# Patient Record
Sex: Male | Born: 1935
Health system: Southern US, Community
[De-identification: ages and names within clinical notes are randomized; demographics above are authoritative.]

## PROBLEM LIST (undated history)

## (undated) DIAGNOSIS — I454 Nonspecific intraventricular block: Secondary | ICD-10-CM

## (undated) DIAGNOSIS — E119 Type 2 diabetes mellitus without complications: Secondary | ICD-10-CM

## (undated) DIAGNOSIS — K219 Gastro-esophageal reflux disease without esophagitis: Secondary | ICD-10-CM

## (undated) DIAGNOSIS — I10 Essential (primary) hypertension: Secondary | ICD-10-CM

## (undated) DIAGNOSIS — N200 Calculus of kidney: Secondary | ICD-10-CM

## (undated) DIAGNOSIS — I480 Paroxysmal atrial fibrillation: Secondary | ICD-10-CM

## (undated) DIAGNOSIS — G459 Transient cerebral ischemic attack, unspecified: Secondary | ICD-10-CM

## (undated) HISTORY — PX: APPENDECTOMY: SHX54

## (undated) HISTORY — PX: CHOLECYSTECTOMY: SHX55

---

## 2014-12-15 DIAGNOSIS — H2703 Aphakia, bilateral: Secondary | ICD-10-CM | POA: Diagnosis not present

## 2014-12-21 DIAGNOSIS — I129 Hypertensive chronic kidney disease with stage 1 through stage 4 chronic kidney disease, or unspecified chronic kidney disease: Secondary | ICD-10-CM | POA: Diagnosis not present

## 2014-12-21 DIAGNOSIS — E119 Type 2 diabetes mellitus without complications: Secondary | ICD-10-CM | POA: Diagnosis not present

## 2014-12-21 DIAGNOSIS — E780 Pure hypercholesterolemia: Secondary | ICD-10-CM | POA: Diagnosis not present

## 2014-12-21 DIAGNOSIS — E1122 Type 2 diabetes mellitus with diabetic chronic kidney disease: Secondary | ICD-10-CM | POA: Diagnosis not present

## 2014-12-21 DIAGNOSIS — N189 Chronic kidney disease, unspecified: Secondary | ICD-10-CM | POA: Diagnosis not present

## 2014-12-25 DIAGNOSIS — B9689 Other specified bacterial agents as the cause of diseases classified elsewhere: Secondary | ICD-10-CM | POA: Diagnosis not present

## 2014-12-25 DIAGNOSIS — J208 Acute bronchitis due to other specified organisms: Secondary | ICD-10-CM | POA: Diagnosis not present

## 2015-04-19 DIAGNOSIS — E1122 Type 2 diabetes mellitus with diabetic chronic kidney disease: Secondary | ICD-10-CM | POA: Diagnosis not present

## 2015-04-19 DIAGNOSIS — Z79899 Other long term (current) drug therapy: Secondary | ICD-10-CM | POA: Diagnosis not present

## 2015-04-19 DIAGNOSIS — N183 Chronic kidney disease, stage 3 (moderate): Secondary | ICD-10-CM | POA: Diagnosis not present

## 2015-04-19 DIAGNOSIS — E780 Pure hypercholesterolemia: Secondary | ICD-10-CM | POA: Diagnosis not present

## 2015-04-19 DIAGNOSIS — E119 Type 2 diabetes mellitus without complications: Secondary | ICD-10-CM | POA: Diagnosis not present

## 2015-04-19 DIAGNOSIS — I10 Essential (primary) hypertension: Secondary | ICD-10-CM | POA: Diagnosis not present

## 2015-07-17 DIAGNOSIS — I959 Hypotension, unspecified: Secondary | ICD-10-CM | POA: Diagnosis not present

## 2015-07-17 DIAGNOSIS — Z7982 Long term (current) use of aspirin: Secondary | ICD-10-CM | POA: Diagnosis not present

## 2015-07-17 DIAGNOSIS — Z79899 Other long term (current) drug therapy: Secondary | ICD-10-CM | POA: Diagnosis not present

## 2015-07-17 DIAGNOSIS — R531 Weakness: Secondary | ICD-10-CM | POA: Diagnosis not present

## 2015-07-17 DIAGNOSIS — M533 Sacrococcygeal disorders, not elsewhere classified: Secondary | ICD-10-CM | POA: Diagnosis not present

## 2015-07-17 DIAGNOSIS — I10 Essential (primary) hypertension: Secondary | ICD-10-CM | POA: Diagnosis not present

## 2015-07-17 DIAGNOSIS — E860 Dehydration: Secondary | ICD-10-CM | POA: Diagnosis not present

## 2015-07-17 DIAGNOSIS — R0602 Shortness of breath: Secondary | ICD-10-CM | POA: Diagnosis not present

## 2015-07-20 DIAGNOSIS — Z0000 Encounter for general adult medical examination without abnormal findings: Secondary | ICD-10-CM | POA: Diagnosis not present

## 2015-07-20 DIAGNOSIS — I481 Persistent atrial fibrillation: Secondary | ICD-10-CM | POA: Diagnosis not present

## 2015-07-20 DIAGNOSIS — I10 Essential (primary) hypertension: Secondary | ICD-10-CM | POA: Diagnosis not present

## 2015-07-20 DIAGNOSIS — Z23 Encounter for immunization: Secondary | ICD-10-CM | POA: Diagnosis not present

## 2015-07-20 DIAGNOSIS — I482 Chronic atrial fibrillation: Secondary | ICD-10-CM | POA: Diagnosis not present

## 2015-08-03 DIAGNOSIS — N183 Chronic kidney disease, stage 3 (moderate): Secondary | ICD-10-CM | POA: Diagnosis not present

## 2015-08-03 DIAGNOSIS — E1122 Type 2 diabetes mellitus with diabetic chronic kidney disease: Secondary | ICD-10-CM | POA: Diagnosis not present

## 2015-08-03 DIAGNOSIS — I4891 Unspecified atrial fibrillation: Secondary | ICD-10-CM | POA: Diagnosis not present

## 2015-08-06 DIAGNOSIS — I482 Chronic atrial fibrillation: Secondary | ICD-10-CM | POA: Diagnosis not present

## 2015-08-09 DIAGNOSIS — I479 Paroxysmal tachycardia, unspecified: Secondary | ICD-10-CM | POA: Diagnosis not present

## 2015-08-23 DIAGNOSIS — I10 Essential (primary) hypertension: Secondary | ICD-10-CM | POA: Diagnosis not present

## 2015-08-23 DIAGNOSIS — I481 Persistent atrial fibrillation: Secondary | ICD-10-CM | POA: Diagnosis not present

## 2015-09-08 DIAGNOSIS — I480 Paroxysmal atrial fibrillation: Secondary | ICD-10-CM | POA: Diagnosis not present

## 2015-09-08 DIAGNOSIS — I447 Left bundle-branch block, unspecified: Secondary | ICD-10-CM | POA: Diagnosis not present

## 2015-09-08 DIAGNOSIS — I10 Essential (primary) hypertension: Secondary | ICD-10-CM | POA: Diagnosis not present

## 2015-09-08 DIAGNOSIS — I495 Sick sinus syndrome: Secondary | ICD-10-CM | POA: Diagnosis not present

## 2015-09-21 DIAGNOSIS — I482 Chronic atrial fibrillation: Secondary | ICD-10-CM | POA: Diagnosis not present

## 2015-09-21 DIAGNOSIS — I495 Sick sinus syndrome: Secondary | ICD-10-CM | POA: Diagnosis not present

## 2015-10-11 DIAGNOSIS — I4891 Unspecified atrial fibrillation: Secondary | ICD-10-CM | POA: Diagnosis not present

## 2015-10-11 DIAGNOSIS — I10 Essential (primary) hypertension: Secondary | ICD-10-CM | POA: Diagnosis not present

## 2017-07-21 ENCOUNTER — Emergency Department (HOSPITAL_COMMUNITY): Payer: Medicare Other

## 2017-07-21 ENCOUNTER — Observation Stay (HOSPITAL_COMMUNITY): Payer: Medicare Other

## 2017-07-21 ENCOUNTER — Encounter (HOSPITAL_COMMUNITY): Payer: Self-pay | Admitting: Emergency Medicine

## 2017-07-21 ENCOUNTER — Observation Stay (HOSPITAL_COMMUNITY)
Admission: EM | Admit: 2017-07-21 | Discharge: 2017-07-22 | Disposition: A | Discharge status: Home / Self Care | Payer: Medicare Other | Attending: Family Medicine | Admitting: Family Medicine

## 2017-07-21 DIAGNOSIS — I081 Rheumatic disorders of both mitral and tricuspid valves: Secondary | ICD-10-CM | POA: Insufficient documentation

## 2017-07-21 DIAGNOSIS — J324 Chronic pansinusitis: Secondary | ICD-10-CM | POA: Insufficient documentation

## 2017-07-21 DIAGNOSIS — Z87442 Personal history of urinary calculi: Secondary | ICD-10-CM | POA: Insufficient documentation

## 2017-07-21 DIAGNOSIS — R4701 Aphasia: Secondary | ICD-10-CM | POA: Diagnosis not present

## 2017-07-21 DIAGNOSIS — Z87891 Personal history of nicotine dependence: Secondary | ICD-10-CM | POA: Insufficient documentation

## 2017-07-21 DIAGNOSIS — I482 Chronic atrial fibrillation: Secondary | ICD-10-CM | POA: Diagnosis not present

## 2017-07-21 DIAGNOSIS — R4702 Dysphasia: Secondary | ICD-10-CM | POA: Diagnosis not present

## 2017-07-21 DIAGNOSIS — I447 Left bundle-branch block, unspecified: Secondary | ICD-10-CM | POA: Diagnosis not present

## 2017-07-21 DIAGNOSIS — Z7901 Long term (current) use of anticoagulants: Secondary | ICD-10-CM | POA: Diagnosis not present

## 2017-07-21 DIAGNOSIS — I495 Sick sinus syndrome: Secondary | ICD-10-CM | POA: Insufficient documentation

## 2017-07-21 DIAGNOSIS — Z8673 Personal history of transient ischemic attack (TIA), and cerebral infarction without residual deficits: Secondary | ICD-10-CM | POA: Insufficient documentation

## 2017-07-21 DIAGNOSIS — I480 Paroxysmal atrial fibrillation: Secondary | ICD-10-CM | POA: Insufficient documentation

## 2017-07-21 DIAGNOSIS — E785 Hyperlipidemia, unspecified: Secondary | ICD-10-CM | POA: Insufficient documentation

## 2017-07-21 DIAGNOSIS — G3189 Other specified degenerative diseases of nervous system: Secondary | ICD-10-CM | POA: Diagnosis not present

## 2017-07-21 DIAGNOSIS — K219 Gastro-esophageal reflux disease without esophagitis: Secondary | ICD-10-CM | POA: Diagnosis not present

## 2017-07-21 DIAGNOSIS — R4182 Altered mental status, unspecified: Secondary | ICD-10-CM | POA: Diagnosis present

## 2017-07-21 DIAGNOSIS — G459 Transient cerebral ischemic attack, unspecified: Principal | ICD-10-CM | POA: Insufficient documentation

## 2017-07-21 DIAGNOSIS — I10 Essential (primary) hypertension: Secondary | ICD-10-CM | POA: Insufficient documentation

## 2017-07-21 DIAGNOSIS — E119 Type 2 diabetes mellitus without complications: Secondary | ICD-10-CM | POA: Diagnosis not present

## 2017-07-21 DIAGNOSIS — I639 Cerebral infarction, unspecified: Secondary | ICD-10-CM | POA: Diagnosis not present

## 2017-07-21 HISTORY — DX: Essential (primary) hypertension: I10

## 2017-07-21 HISTORY — DX: Calculus of kidney: N20.0

## 2017-07-21 HISTORY — DX: Gastro-esophageal reflux disease without esophagitis: K21.9

## 2017-07-21 HISTORY — DX: Paroxysmal atrial fibrillation: I48.0

## 2017-07-21 HISTORY — DX: Nonspecific intraventricular block: I45.4

## 2017-07-21 HISTORY — DX: Type 2 diabetes mellitus without complications: E11.9

## 2017-07-21 HISTORY — DX: Transient cerebral ischemic attack, unspecified: G45.9

## 2017-07-21 LAB — URINALYSIS, ROUTINE W REFLEX MICROSCOPIC
BACTERIA UA: NONE SEEN
Bilirubin Urine: NEGATIVE
Glucose, UA: NEGATIVE mg/dL
Ketones, ur: NEGATIVE mg/dL
Leukocytes, UA: NEGATIVE
Nitrite: NEGATIVE
PROTEIN: NEGATIVE mg/dL
SPECIFIC GRAVITY, URINE: 1.009 (ref 1.005–1.030)
pH: 5 (ref 5.0–8.0)

## 2017-07-21 LAB — CBC
HCT: 43 % (ref 39.0–52.0)
Hemoglobin: 14.4 g/dL (ref 13.0–17.0)
MCH: 31.2 pg (ref 26.0–34.0)
MCHC: 33.5 g/dL (ref 30.0–36.0)
MCV: 93.1 fL (ref 78.0–100.0)
PLATELETS: 189 10*3/uL (ref 150–400)
RBC: 4.62 MIL/uL (ref 4.22–5.81)
RDW: 13.5 % (ref 11.5–15.5)
WBC: 6.6 10*3/uL (ref 4.0–10.5)

## 2017-07-21 LAB — I-STAT TROPONIN, ED: Troponin i, poc: 0 ng/mL (ref 0.00–0.08)

## 2017-07-21 LAB — CBG MONITORING, ED: GLUCOSE-CAPILLARY: 98 mg/dL (ref 65–99)

## 2017-07-21 LAB — COMPREHENSIVE METABOLIC PANEL
ALK PHOS: 51 U/L (ref 38–126)
ALT: 10 U/L — ABNORMAL LOW (ref 17–63)
ANION GAP: 14 (ref 5–15)
AST: 25 U/L (ref 15–41)
Albumin: 4.4 g/dL (ref 3.5–5.0)
BILIRUBIN TOTAL: 1.2 mg/dL (ref 0.3–1.2)
BUN: 19 mg/dL (ref 6–20)
CO2: 21 mmol/L — ABNORMAL LOW (ref 22–32)
Calcium: 9.5 mg/dL (ref 8.9–10.3)
Chloride: 103 mmol/L (ref 101–111)
Creatinine, Ser: 1.27 mg/dL — ABNORMAL HIGH (ref 0.61–1.24)
GFR calc Af Amer: 58 mL/min — ABNORMAL LOW (ref >60)
GFR calc non Af Amer: 50 mL/min — ABNORMAL LOW (ref >60)
GLUCOSE: 104 mg/dL — AB (ref 65–99)
POTASSIUM: 3.5 mmol/L (ref 3.5–5.1)
Sodium: 138 mmol/L (ref 135–145)
TOTAL PROTEIN: 7.5 g/dL (ref 6.5–8.1)

## 2017-07-21 LAB — I-STAT CHEM 8, ED
BUN: 21 mg/dL — AB (ref 6–20)
CALCIUM ION: 1.22 mmol/L (ref 1.15–1.40)
CHLORIDE: 104 mmol/L (ref 101–111)
Creatinine, Ser: 1.3 mg/dL — ABNORMAL HIGH (ref 0.61–1.24)
GLUCOSE: 102 mg/dL — AB (ref 65–99)
HCT: 45 % (ref 39.0–52.0)
Hemoglobin: 15.3 g/dL (ref 13.0–17.0)
Potassium: 3.6 mmol/L (ref 3.5–5.1)
SODIUM: 142 mmol/L (ref 135–145)
TCO2: 24 mmol/L (ref 22–32)

## 2017-07-21 LAB — DIFFERENTIAL
BASOS ABS: 0 10*3/uL (ref 0.0–0.1)
Basophils Relative: 0 %
EOS ABS: 0.1 10*3/uL (ref 0.0–0.7)
EOS PCT: 1 %
LYMPHS ABS: 0.9 10*3/uL (ref 0.7–4.0)
LYMPHS PCT: 14 %
MONOS PCT: 8 %
Monocytes Absolute: 0.5 10*3/uL (ref 0.1–1.0)
NEUTROS PCT: 77 %
Neutro Abs: 5.1 10*3/uL (ref 1.7–7.7)

## 2017-07-21 LAB — PROTIME-INR
INR: 1.1
Prothrombin Time: 14.2 seconds (ref 11.4–15.2)

## 2017-07-21 LAB — APTT: APTT: 35 s (ref 24–36)

## 2017-07-21 MED ORDER — VALSARTAN-HYDROCHLOROTHIAZIDE 320-12.5 MG PO TABS: 1.0000 | ORAL_TABLET | Freq: Every day | ORAL | Status: DC

## 2017-07-21 MED ORDER — SENNOSIDES-DOCUSATE SODIUM 8.6-50 MG PO TABS: 1.0000 | ORAL_TABLET | Freq: Every evening | ORAL | Status: DC | PRN

## 2017-07-21 MED ORDER — HYDROCHLOROTHIAZIDE 12.5 MG PO CAPS: 12.5000 mg | ORAL_CAPSULE | Freq: Every day | ORAL | Status: DC

## 2017-07-21 MED ORDER — AMLODIPINE BESYLATE 10 MG PO TABS: 10.0000 mg | ORAL_TABLET | Freq: Every day | ORAL | Status: DC

## 2017-07-21 MED ORDER — IRBESARTAN 300 MG PO TABS: 300.0000 mg | ORAL_TABLET | Freq: Every day | ORAL | Status: DC

## 2017-07-21 MED ORDER — APIXABAN 5 MG PO TABS
5.0000 mg | ORAL_TABLET | Freq: Two times a day (BID) | ORAL | Status: DC
Start: 2017-07-21 — End: 2017-07-22
  Administered 2017-07-21 – 2017-07-22 (×2): 5 mg via ORAL
  Filled 2017-07-21 (×2): qty 1

## 2017-07-21 MED ORDER — GADOBENATE DIMEGLUMINE 529 MG/ML IV SOLN
18.0000 mL | Freq: Once | INTRAVENOUS | Status: AC | PRN
  Administered 2017-07-21: 18 mL via INTRAVENOUS

## 2017-07-21 MED ORDER — ACETAMINOPHEN 160 MG/5ML PO SOLN: 650.0000 mg | ORAL | Status: DC | PRN

## 2017-07-21 MED ORDER — ACETAMINOPHEN 325 MG PO TABS: 650.0000 mg | ORAL_TABLET | ORAL | Status: DC | PRN

## 2017-07-21 MED ORDER — ACETAMINOPHEN 650 MG RE SUPP: 650.0000 mg | RECTAL | Status: DC | PRN

## 2017-07-21 NOTE — H&P (Signed)
Family Medicine [facility] Admission History and Physical Service Pager: [phone]  Patient name: Adrian Hill Medical record number: [id number] Date of birth: 1930-11-08 Age: 83 y.o. Gender: male  Primary Care Provider: Hadley Pen, MD Consultants: Neurology Code Status: Full  Chief Complaint: difficulty speaking  Assessment and Plan: Adrian Hill is a 83 y.o. male presenting after a witnessed episode of expressive dysphagia. PMH is significant for Afib on eliquis, HTN, DM, previous TIA, OA, LBBB noted on previous EKG, sick sinus syndrome w/o pacer.   Expressive aphasia concerning for TIA. Had witnessed episode of expressive aphasia at home, resolved on admission. History of TIA about 20 years ago that affected eye sight permanently per daughter, amaurosis fugax per care everywhere. Risk factors include afib, although anticoagulated on Eliquis and reportedly compliant with medications. Also hypertensive, BPs in ED variable though well controlled outpatient.  CT negative for acute intracranial process.  - Place in observation, attending Dr. Gwendolyn Grant - passed bedside swallow - cardiac monitoring - Risk stratification labs: TSH, A1C, CBC, Lipid panel - neurology consulted in ED, appreciate recommendations - MRI/MRA head and neck - ECHO - am EKG - carotid US - PT/OT/SLP - neuro checks q2h  H/o Diabetes Stable, diet controlled. Glucose on admission 102. - A1C check - monitor with Bmet  HTN BP in ED variable but at goal. Well controlled outpatient per Cardiology records. - Continue home norvasc, amlodipine, diovan  A fib, anticoagulated on Eliquis. Chronic, stable. CHADSVASC 5, compliant on anticoagulation at home - Continue eliquis - am EKG - ECHO  FEN/GI: heart healthy diet Prophylaxis: Eliquis  Disposition: place in observation   History of Present Illness:  Adrian Hill is a 83 y.o. male presenting with self-resolved episode of difficulty speaking.   Daughter states he woke up this morning in usual state of health around 9am. Was sitting at rest earlier this morning around 9:30am after breakfast and had difficulty speaking "wouldn't come out right" though he knew what he wanted to say. Per daughter, patient was talking but had difficulty with word finding "freezing instead of raining" without slurring or LOC or facial droop or weakness or confusion. Lasted 10-16min before self resolving. Had a headache that has since resolved. Has some lightheadedness, no dizziness. No vision changes. Has had TIA in the past 20+ year ago that caused some vision loss.  Review Of Systems: Per HPI with the following additions:   Review of Systems  Constitutional: Negative for chills and fever.  Eyes: Negative for photophobia.  Respiratory: Negative for shortness of breath.   Cardiovascular: Negative for chest pain and palpitations.  Gastrointestinal: Positive for constipation. Negative for abdominal pain, diarrhea, heartburn, nausea and vomiting.  Musculoskeletal: Negative for falls and myalgias.  Neurological: Positive for speech change (word finding difficulty) and headaches. Negative for dizziness, focal weakness, seizures and loss of consciousness.    Patient Active Problem List   Diagnosis Date Noted  . Altered mental status 07/21/2017    Past Medical History: Past Medical History:  Diagnosis Date  . Acid reflux   . BBB (bundle branch block)    Left BBB  . Diabetes (HCC)   . HTN (hypertension)   . Kidney stones   . Paroxysmal A-fib (HCC)   . TIA (transient ischemic attack)     Past Surgical History: Past Surgical History:  Procedure Laterality Date  . APPENDECTOMY    . CHOLECYSTECTOMY      Social History: Social History  Substance Use Topics  .  Smoking status: Former Smoker    Types: Cigars    Quit date: 54  . Smokeless tobacco: Never Used  . Alcohol use No   Additional social history: no recreational drugs. Lives at home by  himself. Please also refer to relevant sections of EMR.  Family History: Family History  Problem Relation Age of Onset  . Cancer Father   . Benign prostatic hyperplasia Brother   . Hypertension Brother   . Diabetes Mother   . Heart disease Mother     Allergies and Medications: No Known Allergies No current facility-administered medications on file prior to encounter.    No current outpatient prescriptions on file prior to encounter.    Objective: BP (!) 145/65 (BP Location: Left Arm)   Pulse 81   Temp 98.3 F (36.8 C) (Oral)   Resp 16   Ht  (1.905 m)   Wt 191 lb 3.2 oz (86.7 kg)   SpO2 100%   BMI 23.90 kg/m   Exam: General: lying comfortably in bed, in NAD Eyes: PERRL, EOMI, sclera white ENTM: mucous membranes moist Cardiovascular: irregularly irregular rhythm, normal rate. No murmurs/rubs/gallops Respiratory: CTA bilaterally, normal WOB, no wheezes/rales/rhonchi Gastrointestinal: NABS present, soft non tender to palpation, non distended Derm: WWP, no rashes, lesions MSK: Full ROM, strength and deep reflexes intact.  No edema.  Neuro: Alert and oriented, speech normal.  Optic field diminished at baseline with limited peripheral vision. Extraocular movements intact.  Intact symmetric sensation to light touch of face bilaterally.  Hearing loss bilaterally.  Tongue protrudes normally with no deviation.  Shoulder shrug, smile symmetric. Sensation intact and symmetric to extremities bilaterally. Strength 5/5 in all extremities. Intact finger to nose bilaterally. Psych: mood and affect euthymic.  Labs and Imaging: CBC BMET   Recent Labs Lab 07/21/17 1116 07/21/17 1135  WBC 6.6  --   HGB 14.4 15.3  HCT 43.0 45.0  PLT 189  --     Recent Labs Lab 07/21/17 1116 07/21/17 1135  NA 138 142  K 3.5 3.6  CL 103 104  CO2 21*  --   BUN 19 21*  CREATININE 1.27* 1.30*  GLUCOSE 104* 102*  CALCIUM 9.5  --       Ct Head Wo Contrast  Result Date:  07/21/2017 CLINICAL DATA:  Episode of aphasia this morning lasting about 15 minutes. EXAM: CT HEAD WITHOUT CONTRAST TECHNIQUE: Contiguous axial images were obtained from the base of the skull through the vertex without intravenous contrast. COMPARISON:  12/04/2016 FINDINGS: Brain: Age related atrophy. No sign of old or acute focal infarction, mass lesion, hemorrhage, hydrocephalus or extra-axial collection. Vascular: There is atherosclerotic calcification of the major vessels at the base of the brain. Skull: Normal Sinuses/Orbits: Complete opacification of the maxillary sinuses which are poorly developed. This is a chronic finding. Ethmoid, sphenoid and frontal sinuses are clear. Frontal sinuses are diminutive. Orbits negative. Mastoids clear. Other: None IMPRESSION: Age related atrophy.  No acute or focal finding otherwise. Electronically Signed   By: Paulina Fusi M.D.   On: 07/21/2017 11:49    Ellwood Dense, DO 07/21/2017, 4:02 PM PGY-1, [facility] FPTS Intern pager: [phone], text pages welcome  FPTS Upper-Level Resident Addendum  I have independently interviewed and examined the patient. I have discussed the above with the original author and agree with their documentation. My edits for correction/addition/clarification are in blue. Please see also any attending notes.   Leland Her, DO PGY-2, [facility] 07/21/2017 4:46  PM  FPTS Service pager: [phone] (text pages welcome through [facility])

## 2017-07-21 NOTE — ED Triage Notes (Signed)
CBG 98  

## 2017-07-21 NOTE — Evaluation (Addendum)
Physical Therapy Evaluation Patient Details Name: Adrian Hill MRN: [id number] DOB: 09-29-1931 Today's Date: 07/21/2017   History of Present Illness  Pt is an 83 y/o male admitted secondary to difficulty speaking. CT was negative. PMH including but not limited to DM, HTN, a-fib and hx of TIA.  Clinical Impression  Pt presented supine in bed with HOB elevated, awake and willing to participate in therapy session. Prior to admission, pt reported that he was independent with all functional mobility and ADLs. Pt reported that he lives alone with his dog but has family that lives very close by and could assist PRN. Pt ambulated in hallway with supervision for safety, no instability or overt LOB. Pt reported that he feels back to his baseline level of function. Pt's family present throughout session, with no questions or concerns regarding pt's functional mobility. No further acute PT needs identified at this time. PT signing off.    Follow Up Recommendations No PT follow up    Equipment Recommendations  None recommended by PT    Recommendations for Other Services       Precautions / Restrictions Precautions Precautions: None Restrictions Weight Bearing Restrictions: No      Mobility  Bed Mobility Overal bed mobility: Modified Independent                Transfers Overall transfer level: Needs assistance Equipment used: None Transfers: Sit to/from Stand Sit to Stand: Supervision         General transfer comment: supervision for safety  Ambulation/Gait Ambulation/Gait assistance: Supervision Ambulation Distance (Feet): 150 Feet Assistive device: None Gait Pattern/deviations: Step-through pattern;Decreased stride length (R LE externally rotated) Gait velocity: decreased   General Gait Details: no instability or LOB, supervision for safety  Stairs            Wheelchair Mobility    Modified Rankin (Stroke Patients Only)       Balance Overall balance  assessment: Needs assistance Sitting-balance support: Feet supported;No upper extremity supported Sitting balance-Leahy Scale: Good     Standing balance support: No upper extremity supported;During functional activity Standing balance-Leahy Scale: Fair                               Pertinent Vitals/Pain Pain Assessment: No/denies pain    Home Living Family/patient expects to be discharged to:: Private residence Living Arrangements: Alone Available Help at Discharge: Family;Available PRN/intermittently Type of Home: House Home Access: Stairs to enter Entrance Stairs-Rails: Doctor, general practice of Steps: 5 Home Layout: One level Home Equipment: None      Prior Function Level of Independence: Independent               Hand Dominance        Extremity/Trunk Assessment   Upper Extremity Assessment Upper Extremity Assessment: Overall WFL for tasks assessed    Lower Extremity Assessment Lower Extremity Assessment: Overall WFL for tasks assessed       Communication   Communication: HOH  Cognition Arousal/Alertness: Awake/alert Behavior During Therapy: WFL for tasks assessed/performed Overall Cognitive Status: Within Functional Limits for tasks assessed                                 General Comments: cognition not formally assessed but [facility] for general conversation      General Comments      Exercises     Assessment/Plan  PT Assessment Patent does not need any further PT services  PT Problem List         PT Treatment Interventions      PT Goals (Current goals can be found in the Care Plan section)  Acute Rehab PT Goals Patient Stated Goal: return home    Frequency     Barriers to discharge        Co-evaluation               AM-PAC PT "6 Clicks" Daily Activity  Outcome Measure Difficulty turning over in bed (including adjusting bedclothes, sheets and blankets)?: None Difficulty moving from  lying on back to sitting on the side of the bed? : None Difficulty sitting down on and standing up from a chair with arms (e.g., wheelchair, bedside commode, etc,.)?: None Help needed moving to and from a bed to chair (including a wheelchair)?: None Help needed walking in hospital room?: None Help needed climbing 3-5 steps with a railing? : A Little 6 Click Score: 23    End of Session Equipment Utilized During Treatment: Gait belt Activity Tolerance: Patient tolerated treatment well Patient left: in bed;with call bell/phone within reach;with family/visitor present Nurse Communication: Mobility status PT Visit Diagnosis: Other abnormalities of gait and mobility (R26.89)    Time: 1610-9604 PT Time Calculation (min) (ACUTE ONLY): 23 min   Charges:   PT Evaluation $PT Eval Moderate Complexity: 1 Mod PT Treatments $Gait Training: 8-22 mins   PT G Codes:   PT G-Codes **NOT FOR INPATIENT CLASS** Functional Assessment Tool Used: AM-PAC 6 Clicks Basic Mobility;Clinical judgement Functional Limitation: Mobility: Walking and moving around Mobility: Walking and Moving Around Current Status (V4098): At least 1 percent but less than 20 percent impaired, limited or restricted Mobility: Walking and Moving Around Goal Status [phone]): 0 percent impaired, limited or restricted Mobility: Walking and Moving Around Discharge Status [phone]): At least 1 percent but less than 20 percent impaired, limited or restricted    [facility] [place], DPT [phone]   Adrian Hill 07/21/2017, 5:41 PM

## 2017-07-21 NOTE — ED Provider Notes (Signed)
[facility]-EMERGENCY DEPT Provider Note   CSN: [id number] Arrival date & time: 07/21/17  1056     History   Chief Complaint Chief Complaint  Patient presents with  . Altered Mental Status    HPI Adrian Hill is a 83 y.o. male.  83 year old male with past medical history including HTN, A fib on Eliquis, TIA who p/w aphasia. Daughter states that this morning he was at their house and ate breakfast and shortly afterwards was trying to tell them something but for 15-30 minutes had difficulty getting his words out. He seemed like he knew what he was trying to say but cannot form the words. He eventually was able to speak normally again and has not had a reoccurrence since this episode. He denies any extremity numbness or weakness or any visual changes. He has been ambulatory without problems. Daughter reports that he has had history of TIA in the past but no history of stroke. He is compliant with medications.    The history is provided by the patient and a relative.    History reviewed. No pertinent past medical history.  There are no active problems to display for this patient.   No past surgical history on file.     Home Medications    Prior to Admission medications   Not on File    Family History No family history on file.  Social History Social History  Substance Use Topics  . Smoking status: Not on file  . Smokeless tobacco: Not on file  . Alcohol use Not on file     Allergies   Patient has no known allergies.   Review of Systems Review of Systems All other systems reviewed and are negative except that which was mentioned in HPI   Physical Exam Updated Vital Signs BP 136/62   Temp 97.7 F (36.5 C)   Resp 19   SpO2 100%   Physical Exam  Constitutional: He is oriented to person, place, and time. He appears well-developed and well-nourished. No distress.  Awake, alert  HENT:  Head: Normocephalic and atraumatic.  Eyes: Pupils are equal, round, and  reactive to light. Conjunctivae and EOM are normal.  Neck: Neck supple.  Cardiovascular: Normal rate, regular rhythm and normal heart sounds.   No murmur heard. Pulmonary/Chest: Effort normal and breath sounds normal. No respiratory distress.  Abdominal: Soft. Bowel sounds are normal. He exhibits no distension. There is no tenderness.  Musculoskeletal: He exhibits no edema.  Neurological: He is alert and oriented to person, place, and time. He has normal reflexes. No cranial nerve deficit. He exhibits normal muscle tone.  Fluent speech, no clonus Hard of hearing 5/5 strength and normal sensation x all 4 extremities  Skin: Skin is warm and dry.  Psychiatric: He has a normal mood and affect. Judgment and thought content normal.  Nursing note and vitals reviewed.    ED Treatments / Results  Labs (all labs ordered are listed, but only abnormal results are displayed) Labs Reviewed  COMPREHENSIVE METABOLIC PANEL - Abnormal; Notable for the following:       Result Value   CO2 21 (*)    Glucose, Bld 104 (*)    Creatinine, Ser 1.27 (*)    ALT 10 (*)    GFR calc non Af Amer 50 (*)    GFR calc Af Amer 58 (*)    All other components within normal limits  URINALYSIS, ROUTINE W REFLEX MICROSCOPIC - Abnormal; Notable for the following:  Color, Urine STRAW (*)    Hgb urine dipstick SMALL (*)    Squamous Epithelial / LPF 0-5 (*)    All other components within normal limits  I-STAT CHEM 8, ED - Abnormal; Notable for the following:    BUN 21 (*)    Creatinine, Ser 1.30 (*)    Glucose, Bld 102 (*)    All other components within normal limits  PROTIME-INR  APTT  CBC  DIFFERENTIAL  I-STAT TROPONIN, ED  CBG MONITORING, ED    EKG  EKG Interpretation  Date/Time:  Saturday July 21 2017 11:07:50 EDT Ventricular Rate:  84 PR Interval:    QRS Duration: 136 QT Interval:  380 QTC Calculation: 449 R Axis:   83 Text Interpretation:  Atrial fibrillation Non-specific intra-ventricular  conduction block Cannot rule out Anterior infarct , age undetermined Marked T-wave abnormality, consider inferolateral ischemia Abnormal ECG No previous ECGs available Confirmed by Frederick Peers [phone]) on 07/21/2017 11:25:09 AM       Radiology Ct Head Wo Contrast  Result Date: 07/21/2017 CLINICAL DATA:  Episode of aphasia this morning lasting about 15 minutes. EXAM: CT HEAD WITHOUT CONTRAST TECHNIQUE: Contiguous axial images were obtained from the base of the skull through the vertex without intravenous contrast. COMPARISON:  12/04/2016 FINDINGS: Brain: Age related atrophy. No sign of old or acute focal infarction, mass lesion, hemorrhage, hydrocephalus or extra-axial collection. Vascular: There is atherosclerotic calcification of the major vessels at the base of the brain. Skull: Normal Sinuses/Orbits: Complete opacification of the maxillary sinuses which are poorly developed. This is a chronic finding. Ethmoid, sphenoid and frontal sinuses are clear. Frontal sinuses are diminutive. Orbits negative. Mastoids clear. Other: None IMPRESSION: Age related atrophy.  No acute or focal finding otherwise. Electronically Signed   By: Paulina Fusi M.D.   On: 07/21/2017 11:49    Procedures Procedures (including critical care time)  Medications Ordered in ED Medications - No data to display   Initial Impression / Assessment and Plan / ED Course  I have reviewed the triage vital signs and the nursing notes.  Pertinent labs & imaging results that were available during my care of the patient were reviewed by me and considered in my medical decision making (see chart for details).    PT w/ 15-30 min episode of Aphasia witnessed by daughter. Symptoms resolved prior to arrival. He was well-appearing and comfortable on exam with reassuring vital signs. His neurologic exam was unremarkable. He does have difficulty hearing at baseline. His workup including head CT and screening lab work are reassuring. I  discussed his presentation with neurology, Dr. Otelia Limes, who will see the patient in consultation and agreed with plan to admit for TIA workup. Discussed with Dr. Artist Pais and pt admitted to teaching service for further evaluation.  Final Clinical Impressions(s) / ED Diagnoses   Final diagnoses:  Transient cerebral ischemia, unspecified type    New Prescriptions New Prescriptions   No medications on file     Shalika Arntz, Ambrose [place], MD 07/21/17 1735

## 2017-07-21 NOTE — ED Triage Notes (Addendum)
Pt daughter states normal last night. States he work up this morning and could not get his words out. Pt now speaking normally and appropriately. Ambulatory at triage, using urinal. Neuro screen negative at triage. A& O X4. Pt states yesterday he had a headache to the front of his head on both sides, still has a mild headache to same area. Other than this, he states some weakness since yesterday.

## 2017-07-21 NOTE — Consult Note (Signed)
Referring Physician: Dr. Mingo Amber    Chief Complaint: Transient speech deficit  HPI: Adrian Hill is an 83 y.o. male with atrial fibrillation (on Eliquis) who presents with transient expressive dysphasia. Per family, he woke up normally this AM. Later on in the morning, he was noted to be mixing up words and with hesitant speech, but comprehension was intact and after correcting himself, could express himself understandably to others. "He seemed like he knew what he was trying to say but cannot form the words." Symptoms lasted for 15-30 minutes. The patient also endorsed a mild frontal headache beginning yesterday, which still persisted at the time of his Triage assessment. He has no prior history of stroke but has had a TIA in the past. He denies new vision changes but does have bilateral vision loss, left worse than right, secondary to macular degeneration. CBG was 98 on arrival.   The patient has not had chest pain, limb weakness, sensory numbness, trouble swallowing, trouble walking or incoordination.   Past Medical History:  Diagnosis Date  . Acid reflux   . BBB (bundle branch block)    Left BBB  . Diabetes ([place])   . HTN (hypertension)   . Kidney stones   . Paroxysmal A-fib ([place])   . TIA (transient ischemic attack)     Past Surgical History:  Procedure Laterality Date  . APPENDECTOMY    . CHOLECYSTECTOMY      Family History  Problem Relation Age of Onset  . Cancer Father   . Benign prostatic hyperplasia Brother   . Hypertension Brother   . Diabetes Mother   . Heart disease Mother    Social History:  reports that he quit smoking about 22 years ago. His smoking use included Cigars. He has never used smokeless tobacco. He reports that he does not drink alcohol or use drugs.  Allergies: No Known Allergies  Medications:  Prior to Admission:  Prescriptions Prior to Admission  Medication Sig Dispense Refill Last Dose  . acetaminophen (TYLENOL) 500 MG tablet Take 500 mg by  mouth every 6 (six) hours as needed for mild pain.   PRN  . amLODipine (NORVASC) 10 MG tablet Take 10 mg by mouth daily.   07/21/2017 at Unknown time  . apixaban (ELIQUIS) 5 MG TABS tablet Take 5 mg by mouth 2 (two) times daily.   07/21/2017 at 0930  . Menthol, Topical Analgesic, (ICY HOT EX) Apply 1 application topically as needed (Joint pain).   Past Week at Unknown time  . valsartan-hydrochlorothiazide (DIOVAN-HCT) 320-12.5 MG tablet Take 1 tablet by mouth daily.   07/21/2017 at Unknown time   Scheduled: . [START ON 07/22/2017] amLODipine  10 mg Oral Daily  . apixaban  5 mg Oral BID  . [START ON 07/22/2017] irbesartan  300 mg Oral Daily   And  . [START ON 07/22/2017] hydrochlorothiazide  12.5 mg Oral Daily    ROS: As per HPI.   Physical Examination: Blood pressure (!) 142/73, pulse (!) 57, temperature 98.2 F (36.8 C), temperature source Oral, resp. rate 16, height '6\' 3"'  (1.905 m), weight 86.7 kg (191 lb 3.2 oz), SpO2 100 %.  HEENT: [place]/AT Lungs: Respirations unlabored Ext: No edema  Neurologic Examination: Mental Status: Alert, oriented, thought content appropriate. Pleasant and cooperative. Speech fluent with intact comprehension and naming. Able to follow all commands without difficulty. Cranial Nerves: II:  Visual fields intact, diminished visual acuity noted. No pupillary asymmetry noted.  III,IV, VI: Ptosis not present, EOMI without nystagmus V,VII:  Smile symmetric, facial temp sensation normal bilaterally VIII: HOH IX,X: No hypophonia XI: Symmetric XII: Midline tongue extension  Motor: Right : Upper extremity   5/5    Left:     Upper extremity   5/5  Lower extremity   5/5     Lower extremity   5/5 Normal tone throughout; no atrophy noted Sensory: Temp and light touch intact x 4. No extinction.  Deep Tendon Reflexes:  2+ biceps, brachioradialis and patellae bilaterally. 0 achilles bilaterally. Toes downgoing.  Cerebellar: No ataxia with FNF bilaterally  Gait:  Deferred  Results for orders placed or performed during the hospital encounter of 07/21/17 (from the past 48 hour(s))  Urinalysis, Routine w reflex microscopic     Status: Abnormal   Collection Time: 07/21/17 11:12 AM  Result Value Ref Range   Color, Urine STRAW (A) YELLOW   APPearance CLEAR CLEAR   Specific Gravity, Urine 1.009 1.005 - 1.030   pH 5.0 5.0 - 8.0   Glucose, UA NEGATIVE NEGATIVE mg/dL   Hgb urine dipstick SMALL (A) NEGATIVE   Bilirubin Urine NEGATIVE NEGATIVE   Ketones, ur NEGATIVE NEGATIVE mg/dL   Protein, ur NEGATIVE NEGATIVE mg/dL   Nitrite NEGATIVE NEGATIVE   Leukocytes, UA NEGATIVE NEGATIVE   RBC / HPF 0-5 0 - 5 RBC/hpf   WBC, UA 0-5 0 - 5 WBC/hpf   Bacteria, UA NONE SEEN NONE SEEN   Squamous Epithelial / LPF 0-5 (A) NONE SEEN   Mucus PRESENT   Protime-INR     Status: None   Collection Time: 07/21/17 11:16 AM  Result Value Ref Range   Prothrombin Time 14.2 11.4 - 15.2 seconds   INR 1.10   APTT     Status: None   Collection Time: 07/21/17 11:16 AM  Result Value Ref Range   aPTT 35 24 - 36 seconds  CBC     Status: None   Collection Time: 07/21/17 11:16 AM  Result Value Ref Range   WBC 6.6 4.0 - 10.5 K/uL   RBC 4.62 4.22 - 5.81 MIL/uL   Hemoglobin 14.4 13.0 - 17.0 g/dL   HCT 43.0 39.0 - 52.0 %   MCV 93.1 78.0 - 100.0 fL   MCH 31.2 26.0 - 34.0 pg   MCHC 33.5 30.0 - 36.0 g/dL   RDW 13.5 11.5 - 15.5 %   Platelets 189 150 - 400 K/uL  Differential     Status: None   Collection Time: 07/21/17 11:16 AM  Result Value Ref Range   Neutrophils Relative % 77 %   Neutro Abs 5.1 1.7 - 7.7 K/uL   Lymphocytes Relative 14 %   Lymphs Abs 0.9 0.7 - 4.0 K/uL   Monocytes Relative 8 %   Monocytes Absolute 0.5 0.1 - 1.0 K/uL   Eosinophils Relative 1 %   Eosinophils Absolute 0.1 0.0 - 0.7 K/uL   Basophils Relative 0 %   Basophils Absolute 0.0 0.0 - 0.1 K/uL  Comprehensive metabolic panel     Status: Abnormal   Collection Time: 07/21/17 11:16 AM  Result Value Ref  Range   Sodium 138 135 - 145 mmol/L   Potassium 3.5 3.5 - 5.1 mmol/L   Chloride 103 101 - 111 mmol/L   CO2 21 (L) 22 - 32 mmol/L   Glucose, Bld 104 (H) 65 - 99 mg/dL   BUN 19 6 - 20 mg/dL   Creatinine, Ser 1.27 (H) 0.61 - 1.24 mg/dL   Calcium 9.5 8.9 - 10.3 mg/dL  Total Protein 7.5 6.5 - 8.1 g/dL   Albumin 4.4 3.5 - 5.0 g/dL   AST 25 15 - 41 U/L   ALT 10 (L) 17 - 63 U/L   Alkaline Phosphatase 51 38 - 126 U/L   Total Bilirubin 1.2 0.3 - 1.2 mg/dL   GFR calc non Af Amer 50 (L) >60 mL/min   GFR calc Af Amer 58 (L) >60 mL/min    Comment: (NOTE) The eGFR has been calculated using the CKD EPI equation. This calculation has not been validated in all clinical situations. eGFR's persistently <60 mL/min signify possible Chronic Kidney Disease.    Anion gap 14 5 - 15  I-stat troponin, ED     Status: None   Collection Time: 07/21/17 11:29 AM  Result Value Ref Range   Troponin i, poc 0.00 0.00 - 0.08 ng/mL   Comment 3            Comment: Due to the release kinetics of cTnI, a negative result within the first hours of the onset of symptoms does not rule out myocardial infarction with certainty. If myocardial infarction is still suspected, repeat the test at appropriate intervals.   I-Stat Chem 8, ED     Status: Abnormal   Collection Time: 07/21/17 11:35 AM  Result Value Ref Range   Sodium 142 135 - 145 mmol/L   Potassium 3.6 3.5 - 5.1 mmol/L   Chloride 104 101 - 111 mmol/L   BUN 21 (H) 6 - 20 mg/dL   Creatinine, Ser 1.30 (H) 0.61 - 1.24 mg/dL   Glucose, Bld 102 (H) 65 - 99 mg/dL   Calcium, Ion 1.22 1.15 - 1.40 mmol/L   TCO2 24 22 - 32 mmol/L   Hemoglobin 15.3 13.0 - 17.0 g/dL   HCT 45.0 39.0 - 52.0 %  CBG monitoring, ED     Status: None   Collection Time: 07/21/17 12:20 PM  Result Value Ref Range   Glucose-Capillary 98 65 - 99 mg/dL   Ct Head Wo Contrast  Result Date: 07/21/2017 CLINICAL DATA:  Episode of aphasia this morning lasting about 15 minutes. EXAM: CT HEAD WITHOUT  CONTRAST TECHNIQUE: Contiguous axial images were obtained from the base of the skull through the vertex without intravenous contrast. COMPARISON:  12/04/2016 FINDINGS: Brain: Age related atrophy. No sign of old or acute focal infarction, mass lesion, hemorrhage, hydrocephalus or extra-axial collection. Vascular: There is atherosclerotic calcification of the major vessels at the base of the brain. Skull: Normal Sinuses/Orbits: Complete opacification of the maxillary sinuses which are poorly developed. This is a chronic finding. Ethmoid, sphenoid and frontal sinuses are clear. Frontal sinuses are diminutive. Orbits negative. Mastoids clear. Other: None IMPRESSION: Age related atrophy.  No acute or focal finding otherwise. Electronically Signed   By: Nelson Chimes M.D.   On: 07/21/2017 11:49    Assessment: 83 y.o. male with transient expressive dysphasia. Most likely secondary to TIA.  1. Neurological exam is nonfocal. Speech is normal. 2. CT head shows no acute abnormality. Age related atrophy is noted. 3. Stroke Risk Factors - HTN, DM, atrial fibrillation, prior TIA.  Plan: 1. Continue Eliquis 2. Given advanced age, benefits of statin most likely outweighed by risks 3. MRI, MRA of the brain without contrast 4. Echocardiogram 5. Carotid dopplers 6. Telemetry monitoring 7. Frequent neuro checks 8. HgbA1c, fasting lipid panel 9.  PT consult, OT consult, Speech consult 10. Permissive HTN until noon on Sunday 11. Encourage PO hydration  '@Electronically'  signed: Dr. Kerney Elbe  07/21/2017, 6:11 PM

## 2017-07-21 NOTE — Progress Notes (Signed)
Pt arrived from ED by stretcher. Family at bedside. Oriented to room.

## 2017-07-21 NOTE — Progress Notes (Signed)
Telemetry called to report a pause on the monitor; strip saved to epic; MD notified.

## 2017-07-22 ENCOUNTER — Observation Stay (HOSPITAL_BASED_OUTPATIENT_CLINIC_OR_DEPARTMENT_OTHER): Payer: Medicare Other

## 2017-07-22 ENCOUNTER — Encounter (HOSPITAL_COMMUNITY): Payer: PRIVATE HEALTH INSURANCE

## 2017-07-22 DIAGNOSIS — I340 Nonrheumatic mitral (valve) insufficiency: Secondary | ICD-10-CM

## 2017-07-22 DIAGNOSIS — R4701 Aphasia: Secondary | ICD-10-CM | POA: Diagnosis not present

## 2017-07-22 DIAGNOSIS — I361 Nonrheumatic tricuspid (valve) insufficiency: Secondary | ICD-10-CM

## 2017-07-22 DIAGNOSIS — R4182 Altered mental status, unspecified: Secondary | ICD-10-CM

## 2017-07-22 DIAGNOSIS — G459 Transient cerebral ischemic attack, unspecified: Secondary | ICD-10-CM | POA: Diagnosis not present

## 2017-07-22 LAB — TSH: TSH: 1.451 u[IU]/mL (ref 0.350–4.500)

## 2017-07-22 LAB — ECHOCARDIOGRAM COMPLETE
Height: 75 in
WEIGHTICAEL: 3059.2 [oz_av]

## 2017-07-22 LAB — LIPID PANEL
CHOL/HDL RATIO: 3.8 ratio
Cholesterol: 141 mg/dL (ref 0–200)
HDL: 37 mg/dL — AB (ref >40)
LDL Cholesterol: 87 mg/dL (ref 0–99)
TRIGLYCERIDES: 83 mg/dL (ref <150)
VLDL: 17 mg/dL (ref 0–40)

## 2017-07-22 LAB — BASIC METABOLIC PANEL
Anion gap: 6 (ref 5–15)
BUN: 17 mg/dL (ref 6–20)
CHLORIDE: 106 mmol/L (ref 101–111)
CO2: 27 mmol/L (ref 22–32)
CREATININE: 1.19 mg/dL (ref 0.61–1.24)
Calcium: 9.5 mg/dL (ref 8.9–10.3)
GFR calc Af Amer: >60 mL/min (ref >60)
GFR calc non Af Amer: 54 mL/min — ABNORMAL LOW (ref >60)
GLUCOSE: 109 mg/dL — AB (ref 65–99)
Potassium: 3.8 mmol/L (ref 3.5–5.1)
Sodium: 139 mmol/L (ref 135–145)

## 2017-07-22 LAB — CBC
HEMATOCRIT: 41.3 % (ref 39.0–52.0)
HEMOGLOBIN: 14 g/dL (ref 13.0–17.0)
MCH: 31.3 pg (ref 26.0–34.0)
MCHC: 33.9 g/dL (ref 30.0–36.0)
MCV: 92.4 fL (ref 78.0–100.0)
Platelets: 187 10*3/uL (ref 150–400)
RBC: 4.47 MIL/uL (ref 4.22–5.81)
RDW: 13.3 % (ref 11.5–15.5)
WBC: 6.6 10*3/uL (ref 4.0–10.5)

## 2017-07-22 LAB — HEMOGLOBIN A1C
Hgb A1c MFr Bld: 6.2 % — ABNORMAL HIGH (ref 4.8–5.6)
MEAN PLASMA GLUCOSE: 131.24 mg/dL

## 2017-07-22 NOTE — Progress Notes (Signed)
Pt given discharge instructions and took down via wheelchair with all of pt belongings.

## 2017-07-22 NOTE — Progress Notes (Signed)
  Speech Language Pathology  Patient Details Name: Adrian Hill MRN: [id number] DOB: [birth year] Today's Date: 07/22/2017 Time:  -     Spoke with pt and daughter/family re: current cognitive-speech-language status. Language is back to baseline; no difficulties with cognition. No ST needs.            Royce Macadamia 07/22/2017, 11:01 AM  Breck Coons Lonell Face.Ed [employer] [phone]

## 2017-07-22 NOTE — Progress Notes (Signed)
Family Medicine Teaching Service Daily Progress Note Intern Pager: [phone]  Patient name: Adrian Hill Medical record number: [id number] Date of birth: 11-03-1931 Age: 83 y.o. Gender: male  Primary Care Provider: Hadley Pen, MD Consultants: Neurology Code Status: Full  Pt Overview and Major Events to Date:  9/15 placed in observation for expressive aphasia  Assessment and Plan: Adrian Hill is a 83 y.o. male presenting after a witnessed episode of expressive dysphagia. PMH is significant for Afib on eliquis, HTN, DM, previous TIA, OA, LBBB noted on previous EKG, sick sinus syndrome w/o pacer.   Expressive aphasia concerning for TIA. Had witnessed episode of expressive aphasia at home, resolved on admission. Neuro exam unremarkable this morning. Risk factors include afib on Eliquis, DM, HTN, prior TIA. CT head and MRI/MRA head&neck negative for acute intracranial process.  - cardiac monitoring - Risk stratification labs: TSH 1.451, A1C 6.2, LDL 87 - neurology consulted in ED following, appreciate recommendations - ECHO - carotid US - PT/OT/SLP - neuro checks q2h  H/o Diabetes Stable, diet controlled. A1c 6.2 c/w prediabetes - monitor gluc with Bmet - patient wishes to discuss management with PCP as outpt  HTN Normotensive  - holding home meds until noon today 9/16 per neuro  A fib, anticoagulated on Eliquis. Chronic, stable. CHADSVASC 5, compliant on anticoagulation at home - Continue eliquis - ECHO  FEN/GI: heart healthy diet Prophylaxis: Eliquis  Disposition: pending neuro work up  Subjective:  Feels well this morning, no concerns. States has been on atorvastatin in the past and does not wish to resume, wants to discuss hyperlipidemia and prediabetes with PCP as outpatient.  Objective: Temp:  [97.7 F (36.5 C)-98.3 F (36.8 C)] 97.7 F (36.5 C) (09/16 0529) Pulse Rate:  [41-81] 64 (09/16 0529) Resp:  [16-20] 18 (09/16 0529) BP: (106-154)/(47-75)  124/64 (09/16 0529) SpO2:  [98 %-100 %] 98 % (09/16 0529) Weight:  [86.7 kg (191 lb 3.2 oz)] 86.7 kg (191 lb 3.2 oz) (09/15 1459) Physical Exam: General: sitting up in bed comfortably, in NAD Cardiovascular: RRR, no murmurs Respiratory: CTAB, normal work of breathing on room air Abdomen: soft, nontender, nondistended, + bowel sounds Extremities: no LE edema, moving all limbs equally Neuro: alert and oriented, strength 5/5 throughout, sensation intact throughout, face symmetric. EOMI and PERRL. DTRs 2+ b/l.  Laboratory:  Recent Labs Lab 07/21/17 1116 07/21/17 1135 07/22/17 0554  WBC 6.6  --  6.6  HGB 14.4 15.3 14.0  HCT 43.0 45.0 41.3  PLT 189  --  187    Recent Labs Lab 07/21/17 1116 07/21/17 1135 07/22/17 0554  NA 138 142 139  K 3.5 3.6 3.8  CL 103 104 106  CO2 21*  --  27  BUN 19 21* 17  CREATININE 1.27* 1.30* 1.19  CALCIUM 9.5  --  9.5  PROT 7.5  --   --   BILITOT 1.2  --   --   ALKPHOS 51  --   --   ALT 10*  --   --   AST 25  --   --   GLUCOSE 104* 102* 109*   TSH 1.451 A1c 6.2  Lipid Panel     Component Value Date/Time   CHOL 141 07/22/2017 0554   TRIG 83 07/22/2017 0554   HDL 37 (L) 07/22/2017 0554   CHOLHDL 3.8 07/22/2017 0554   VLDL 17 07/22/2017 0554   LDLCALC 87 07/22/2017 0554    Imaging/Diagnostic Tests: Ct Head Wo Contrast  Result Date:  07/21/2017 CLINICAL DATA:  Episode of aphasia this morning lasting about 15 minutes. EXAM: CT HEAD WITHOUT CONTRAST TECHNIQUE: Contiguous axial images were obtained from the base of the skull through the vertex without intravenous contrast. COMPARISON:  12/04/2016 FINDINGS: Brain: Age related atrophy. No sign of old or acute focal infarction, mass lesion, hemorrhage, hydrocephalus or extra-axial collection. Vascular: There is atherosclerotic calcification of the major vessels at the base of the brain. Skull: Normal Sinuses/Orbits: Complete opacification of the maxillary sinuses which are poorly developed. This  is a chronic finding. Ethmoid, sphenoid and frontal sinuses are clear. Frontal sinuses are diminutive. Orbits negative. Mastoids clear. Other: None IMPRESSION: Age related atrophy.  No acute or focal finding otherwise. Electronically Signed   By: Paulina Fusi M.D.   On: 07/21/2017 11:49   Mr Adrian Hill Head Wo Contrast  Result Date: 07/21/2017 CLINICAL DATA:  Transient expressive dysphasia. Frontal headache. History of atrial fibrillation on Eliquis, TIA. EXAM: MRI HEAD WITHOUT AND WITH CONTRAST MRA HEAD WITHOUT CONTRAST MRI NECK WITHOUT AND WITH CONTRAST TECHNIQUE: Multiplanar, multiecho pulse sequences of the brain and surrounding structures were obtained without and with intravenous contrast. Angiographic images of the head were obtained using MRA technique without contrast. Multiplanar, multiecho pulse sequences of the neck and surrounding structures were obtained without and with intravenous contrast. CONTRAST:  18mL MULTIHANCE GADOBENATE DIMEGLUMINE 529 MG/ML IV SOLN COMPARISON:  CT HEAD July 21, 2017 at 1145 hours and CT cervical spine December 04, 2016 and MRI/MRA head August 25, 2011 FINDINGS: MRI HEAD FINDINGS INTRACRANIAL CONTENTS: No reduced diffusion to suggest acute ischemia. No susceptibility artifact to suggest hemorrhage. The ventricles and sulci are normal for patient's age. No suspicious parenchymal signal, masses, mass effect. A few scattered subcentimeter supratentorial white matter FLAIR T2 hyperintensities compatible with mild chronic small vessel ischemic disease, less than expected for age. No abnormal intraparenchymal or extra-axial enhancement. No abnormal extra-axial fluid collections. No extra-axial masses. VASCULAR: Normal major intracranial vascular flow voids present at skull base. SKULL AND UPPER CERVICAL SPINE: No abnormal sellar expansion. No suspicious calvarial bone marrow signal. Craniocervical junction maintained. SINUSES/ORBITS: Pan paranasal sinusitis with atretic maxillary  sinuses compatible with chronic sinusitis. The included ocular globes and orbital contents are non-suspicious. OTHER: Patient is edentulous. MRA HEAD FINDINGS ANTERIOR CIRCULATION: Normal flow related enhancement of the included cervical, petrous, cavernous and supraclinoid internal carotid arteries. Patent anterior communicating artery. Patent anterior and middle cerebral arteries, including distal segments. No large vessel occlusion, high-grade stenosis, aneurysm. POSTERIOR CIRCULATION: LEFT vertebral artery is dominant. Basilar artery is patent, with normal flow related enhancement of the main branch vessels. Patent posterior cerebral arteries. No large vessel occlusion, high-grade stenosis, aneurysm. ANATOMIC VARIANTS: Hypoplastic RIGHT A1 segment. Source images and MIP images were reviewed. MRI NECK FINDINGS ANTERIOR CIRCULATION: The common carotid arteries are widely patent bilaterally. The carotid bifurcations are patent bilaterally without hemodynamically significant stenosis by NASCET criteria. No flow limiting stenosis or luminal irregularity. POSTERIOR CIRCULATION: Bilateral vertebral arteries are patent to the vertebrobasilar junction. LEFT vertebral artery is dominant. No flow limiting stenosis or luminal irregularity. Source images and MIP images were reviewed. IMPRESSION: Negative MRI of the head with and without contrast for age. Chronic sinusitis. Negative MRA of the head. Negative MRA of the neck. Electronically Signed   By: Awilda Metro M.D.   On: 07/21/2017 22:53   Mr Adrian Hill Neck W Wo Contrast  Result Date: 07/21/2017 CLINICAL DATA:  Transient expressive dysphasia. Frontal headache. History of atrial fibrillation on Eliquis, TIA. EXAM: MRI  HEAD WITHOUT AND WITH CONTRAST MRA HEAD WITHOUT CONTRAST MRI NECK WITHOUT AND WITH CONTRAST TECHNIQUE: Multiplanar, multiecho pulse sequences of the brain and surrounding structures were obtained without and with intravenous contrast. Angiographic images  of the head were obtained using MRA technique without contrast. Multiplanar, multiecho pulse sequences of the neck and surrounding structures were obtained without and with intravenous contrast. CONTRAST:  18mL MULTIHANCE GADOBENATE DIMEGLUMINE 529 MG/ML IV SOLN COMPARISON:  CT HEAD July 21, 2017 at 1145 hours and CT cervical spine December 04, 2016 and MRI/MRA head August 25, 2011 FINDINGS: MRI HEAD FINDINGS INTRACRANIAL CONTENTS: No reduced diffusion to suggest acute ischemia. No susceptibility artifact to suggest hemorrhage. The ventricles and sulci are normal for patient's age. No suspicious parenchymal signal, masses, mass effect. A few scattered subcentimeter supratentorial white matter FLAIR T2 hyperintensities compatible with mild chronic small vessel ischemic disease, less than expected for age. No abnormal intraparenchymal or extra-axial enhancement. No abnormal extra-axial fluid collections. No extra-axial masses. VASCULAR: Normal major intracranial vascular flow voids present at skull base. SKULL AND UPPER CERVICAL SPINE: No abnormal sellar expansion. No suspicious calvarial bone marrow signal. Craniocervical junction maintained. SINUSES/ORBITS: Pan paranasal sinusitis with atretic maxillary sinuses compatible with chronic sinusitis. The included ocular globes and orbital contents are non-suspicious. OTHER: Patient is edentulous. MRA HEAD FINDINGS ANTERIOR CIRCULATION: Normal flow related enhancement of the included cervical, petrous, cavernous and supraclinoid internal carotid arteries. Patent anterior communicating artery. Patent anterior and middle cerebral arteries, including distal segments. No large vessel occlusion, high-grade stenosis, aneurysm. POSTERIOR CIRCULATION: LEFT vertebral artery is dominant. Basilar artery is patent, with normal flow related enhancement of the main branch vessels. Patent posterior cerebral arteries. No large vessel occlusion, high-grade stenosis, aneurysm.  ANATOMIC VARIANTS: Hypoplastic RIGHT A1 segment. Source images and MIP images were reviewed. MRI NECK FINDINGS ANTERIOR CIRCULATION: The common carotid arteries are widely patent bilaterally. The carotid bifurcations are patent bilaterally without hemodynamically significant stenosis by NASCET criteria. No flow limiting stenosis or luminal irregularity. POSTERIOR CIRCULATION: Bilateral vertebral arteries are patent to the vertebrobasilar junction. LEFT vertebral artery is dominant. No flow limiting stenosis or luminal irregularity. Source images and MIP images were reviewed. IMPRESSION: Negative MRI of the head with and without contrast for age. Chronic sinusitis. Negative MRA of the head. Negative MRA of the neck. Electronically Signed   By: Awilda Metro M.D.   On: 07/21/2017 22:53   Mr Laqueta Jean WU Contrast  Result Date: 07/21/2017 CLINICAL DATA:  Transient expressive dysphasia. Frontal headache. History of atrial fibrillation on Eliquis, TIA. EXAM: MRI HEAD WITHOUT AND WITH CONTRAST MRA HEAD WITHOUT CONTRAST MRI NECK WITHOUT AND WITH CONTRAST TECHNIQUE: Multiplanar, multiecho pulse sequences of the brain and surrounding structures were obtained without and with intravenous contrast. Angiographic images of the head were obtained using MRA technique without contrast. Multiplanar, multiecho pulse sequences of the neck and surrounding structures were obtained without and with intravenous contrast. CONTRAST:  18mL MULTIHANCE GADOBENATE DIMEGLUMINE 529 MG/ML IV SOLN COMPARISON:  CT HEAD July 21, 2017 at 1145 hours and CT cervical spine December 04, 2016 and MRI/MRA head August 25, 2011 FINDINGS: MRI HEAD FINDINGS INTRACRANIAL CONTENTS: No reduced diffusion to suggest acute ischemia. No susceptibility artifact to suggest hemorrhage. The ventricles and sulci are normal for patient's age. No suspicious parenchymal signal, masses, mass effect. A few scattered subcentimeter supratentorial white matter FLAIR T2  hyperintensities compatible with mild chronic small vessel ischemic disease, less than expected for age. No abnormal intraparenchymal or extra-axial enhancement. No abnormal extra-axial  fluid collections. No extra-axial masses. VASCULAR: Normal major intracranial vascular flow voids present at skull base. SKULL AND UPPER CERVICAL SPINE: No abnormal sellar expansion. No suspicious calvarial bone marrow signal. Craniocervical junction maintained. SINUSES/ORBITS: Pan paranasal sinusitis with atretic maxillary sinuses compatible with chronic sinusitis. The included ocular globes and orbital contents are non-suspicious. OTHER: Patient is edentulous. MRA HEAD FINDINGS ANTERIOR CIRCULATION: Normal flow related enhancement of the included cervical, petrous, cavernous and supraclinoid internal carotid arteries. Patent anterior communicating artery. Patent anterior and middle cerebral arteries, including distal segments. No large vessel occlusion, high-grade stenosis, aneurysm. POSTERIOR CIRCULATION: LEFT vertebral artery is dominant. Basilar artery is patent, with normal flow related enhancement of the main branch vessels. Patent posterior cerebral arteries. No large vessel occlusion, high-grade stenosis, aneurysm. ANATOMIC VARIANTS: Hypoplastic RIGHT A1 segment. Source images and MIP images were reviewed. MRI NECK FINDINGS ANTERIOR CIRCULATION: The common carotid arteries are widely patent bilaterally. The carotid bifurcations are patent bilaterally without hemodynamically significant stenosis by NASCET criteria. No flow limiting stenosis or luminal irregularity. POSTERIOR CIRCULATION: Bilateral vertebral arteries are patent to the vertebrobasilar junction. LEFT vertebral artery is dominant. No flow limiting stenosis or luminal irregularity. Source images and MIP images were reviewed. IMPRESSION: Negative MRI of the head with and without contrast for age. Chronic sinusitis. Negative MRA of the head. Negative MRA of the  neck. Electronically Signed   By: Awilda Metro M.D.   On: 07/21/2017 22:53    Leland Her, DO 07/22/2017, 7:10 AM PGY-2, [facility] FPTS Intern pager: [phone], text pages welcome

## 2017-07-22 NOTE — Progress Notes (Signed)
OT Cancellation Note  Patient Details Name: Adrian Hill MRN: [id number] DOB: 07-21-1931   Cancelled Treatment:    Reason Eval/Treat Not Completed: OT screened, no needs identified, will sign off. Pt functioning at baseline for ADL participation. No acute OT needs identified and will sign off.   Doristine Section, MS OTR/L  Pager: [phone]   Doristine Section 07/22/2017, 7:46 AM

## 2017-07-22 NOTE — Progress Notes (Signed)
STROKE TEAM PROGRESS NOTE   HISTORY OF PRESENT ILLNESS (per record) Adrian Hill is an 83 y.o. male with atrial fibrillation (on Eliquis) who presents with transient expressive dysphasia. Per family, he woke up normally this AM. Later on in the morning, he was noted to be mixing up words and with hesitant speech, but comprehension was intact and after correcting himself, could express himself understandably to others. "He seemed like he knew what he was trying to say but cannot form the words." Symptoms lasted for 15-30 minutes. The patient also endorsed a mild frontal headache beginning yesterday, which still persisted at the time of his Triage assessment. He has no prior history of stroke but has had a TIA in the past. He denies new vision changes but does have bilateral vision loss, left worse than right, secondary to macular degeneration. CBG was 98 on arrival.   The patient has not had chest pain, limb weakness, sensory numbness, trouble swallowing, trouble walking or incoordination.    SUBJECTIVE (INTERVAL HISTORY) His  Daughter and grand daughter are  at the bedside.   He had a 10 minute episode of expressive language difficulties and word finding. He denies prior history of similar episodes of other TIAs or strokes. He has chronic A. fib and states he was compliant with his eliquis   OBJECTIVE Temp:  [97.7 F (36.5 C)-98.3 F (36.8 C)] 97.7 F (36.5 C) (09/16 0529) Pulse Rate:  [41-81] 64 (09/16 0529) Cardiac Rhythm: Atrial fibrillation (09/16 0700) Resp:  [16-20] 18 (09/16 0529) BP: (106-154)/(47-75) 124/64 (09/16 0529) SpO2:  [98 %-100 %] 98 % (09/16 0529) Weight:  [191 lb 3.2 oz (86.7 kg)] 191 lb 3.2 oz (86.7 kg) (09/15 1459)  CBC:   Recent Labs Lab 07/21/17 1116 07/21/17 1135 07/22/17 0554  WBC 6.6  --  6.6  NEUTROABS 5.1  --   --   HGB 14.4 15.3 14.0  HCT 43.0 45.0 41.3  MCV 93.1  --  92.4  PLT 189  --  187    Basic Metabolic Panel:   Recent Labs Lab  07/21/17 1116 07/21/17 1135 07/22/17 0554  NA 138 142 139  K 3.5 3.6 3.8  CL 103 104 106  CO2 21*  --  27  GLUCOSE 104* 102* 109*  BUN 19 21* 17  CREATININE 1.27* 1.30* 1.19  CALCIUM 9.5  --  9.5    Lipid Panel:     Component Value Date/Time   CHOL 141 07/22/2017 0554   TRIG 83 07/22/2017 0554   HDL 37 (L) 07/22/2017 0554   CHOLHDL 3.8 07/22/2017 0554   VLDL 17 07/22/2017 0554   LDLCALC 87 07/22/2017 0554   HgbA1c:  Lab Results  Component Value Date   HGBA1C 6.2 (H) 07/22/2017   Urine Drug Screen: No results found for: LABOPIA, COCAINSCRNUR, LABBENZ, AMPHETMU, THCU, LABBARB  Alcohol Level No results found for: ETH   IMAGING  Ct Head Wo Contrast 07/21/2017 IMPRESSION:  Age related atrophy.  No acute or focal finding otherwise.    Mr Shirlee Latch Wo Contrast MRA Neck 07/21/2017 IMPRESSION:  Negative MRI of the head with and without contrast for age. Chronic sinusitis.  Negative MRA of the head.  Negative MRA of the neck.     Transthoracic Echocardiogram - pending   Bilateral Carotid Dopplers - see negative MRA neck     PHYSICAL EXAM  Vitals:   07/21/17 2210 07/22/17 0030 07/22/17 0230 07/22/17 0529  BP: 126/64 (!) 111/56 (!) 106/54 124/64  Pulse: 76 78 68 64  Resp:   18 18  Temp: 97.9 F (36.6 C) 97.7 F (36.5 C) 97.7 F (36.5 C) 97.7 F (36.5 C)  TempSrc: Oral Oral Oral Oral  SpO2: 99% 98% 98% 98%  Weight:      Height:       Obese elderly Caucasian male currently not in distress. He is extremely hard of hearing.  . Afebrile. Head is nontraumatic. Neck is supple without bruit.    Cardiac exam no murmur or gallop. Lungs are clear to auscultation. Distal pulses are well felt.  Neurological Exam ;  Awake  Alert oriented x 3. Normal speech and language.eye movements full without nystagmus.fundi were not visualized. Vision acuity and fields appear normal. Hearing is severely diminished bilaterally. Palatal movements are normal. Face symmetric. Tongue  midline. Normal strength, tone, reflexes and coordination. Normal sensation. Gait deferred.  ASSESSMENT/PLAN Mr. Adrian Hill is a 83 y.o. male with history of TIA, PAF, hypertension, DM, Left BBBB presenting with transient speech difficulties. He did not receive IV t-PA due to resolution of deficits.  Possible TIA:    Resultant  No deficits CT head - Age related atrophy.  No acute or focal finding otherwise.   MRI head - negative  MRA head - negative  MRA Neck - negative  Carotid Doppler - negative MRA neck -> [place] cancelled  2D Echo - pending  LDL - 87  HgbA1c - 6.2  VTE prophylaxis - Eliquis Diet Heart Room service appropriate? Yes; Fluid consistency: Thin  Eliquis (apixaban) daily prior to admission, now on Eliquis (apixaban) daily  Patient counseled to be compliant with his antithrombotic medications  Ongoing aggressive stroke risk factor management  Therapy recommendations: pending  Disposition: Pending  Hypertension  Stable  Permissive hypertension (OK if < 220/120) but gradually normalize in 5-7 days  Long-term BP goal normotensive  Hyperlipidemia  Home meds: No lipid lowering medications prior to admission  LDL 87, goal < 70  Recommend low dose statin.  Continue statin at discharge  Diabetes  HgbA1c 6.2, goal < 7.0  Controlled  Other Stroke Risk Factors  Advanced age  Quit smoking 22 years ago.  Hx stroke/TIA  PAF  Other Active Problems  Mild obesity  Severe hearing loss bilateral  Hospital day # 0  I have personally examined this patient, reviewed notes, independently viewed imaging studies, participated in medical decision making and plan of care.ROS completed by me personally and pertinent positives fully documented  I have made any additions or clarifications directly to the above note.  He presented with transient expressive aphasia likely due to left hemispheric TIA. He remains at risk for recurrent TIAs and strokes. I had a  long discussion the patient and daughter regarding stroke evaluation, prevention and treatment and answered questions. We discussed alternative treatment options to eliquis including switching to pradaxa, Xarelto or Sayvasa  versus staying on eliquis I do not believe there is strong evidence to suggest either way. Patient and family decided to continue eliquis after our discussion. Greater than 50% time during this 35 minute visit was spent on discussing results of evaluation and treatment options including risk benefit of switching eliquis to alternative agent as well as adding aspirin and answering questions. Follow-up as an outpatient in the stroke clinic in 6 weeks. Discuss with primary medical team  Delia Heady, MD Medical Director [facility] Pager: [phone] 07/22/2017 12:06 PM  Delia Heady, MD Medical Director [facility] Pager: [phone]  07/22/2017 12:06 PM  To contact Stroke Continuity provider, please refer to [email or web address]. After hours, contact General Neurology

## 2017-07-22 NOTE — Progress Notes (Signed)
  Echocardiogram 2D Echocardiogram has been performed.  Pieter Partridge 07/22/2017, 11:38 AM

## 2017-07-22 NOTE — Discharge Summary (Signed)
Family Medicine Teaching [facility] Discharge Summary  Patient name: Adrian Hill Medical record number: [id number] Date of birth: [birth year] Age: 83 y.o. Gender: male Date of Admission: 07/21/2017  Date of Discharge: 07/22/17  Admitting Physician: Tobey Grim, MD  Primary Care Provider: Hadley Pen, MD Consultants: neurology  Indication for Hospitalization: TIA  Discharge Diagnoses/Problem List:  TIA - expressive aphasia resolved H/o diet controlled DM HTN Afib on eliquis  Disposition: home  Discharge Condition: stable  Discharge Exam: see progress note from day of discharge  Brief Hospital Course:  [name] presented with witnessed expressive aphasia x 15 min at home, which self resolved along with mild headache which resolved spontaneously. Hx of TIA 20 years ago with amaurosis fugax, also afib on eliquis. Neurology was consulted. EKG unchanged, troponin neg, lipid panel with LDL 87, TSH 1.45. CT head with age related atrophy, MRA head without large vessel occlusion, high grade stenosis, or aneursym. MRA neck negative. Echo with EF 60-65% and afib, no wall motion abnormalities. As symptoms had resolved, patient did not need any follow up PT/OT/SLP treatment. Neurology discussed possibly changing anticoagulation, but patient elected to remain on eliquis. Neurology also recommended low dose statin - patient would like to discuss with his PCP prior to starting.   Issues for Follow Up:  1. Statin - neuro recommended starting low dose statin, patient elected to discuss this with PCP prior to starting. 2. TIA - neurology recommended follow up in stroke clinic in 6 weeks. Had extensive discussion with patient regarding adding pradaxa vs changing to xarelto, patient elected to stay on eliquis. 3. DM - Hgba1c 6/2 - continued dietary modifications.  Significant Procedures: none  Significant Labs and Imaging:   Recent Labs Lab 07/21/17 1116 07/21/17 1135  07/22/17 0554  WBC 6.6  --  6.6  HGB 14.4 15.3 14.0  HCT 43.0 45.0 41.3  PLT 189  --  187    Recent Labs Lab 07/21/17 1116 07/21/17 1135 07/22/17 0554  NA 138 142 139  K 3.5 3.6 3.8  CL 103 104 106  CO2 21*  --  27  GLUCOSE 104* 102* 109*  BUN 19 21* 17  CREATININE 1.27* 1.30* 1.19  CALCIUM 9.5  --  9.5  ALKPHOS 51  --   --   AST 25  --   --   ALT 10*  --   --   ALBUMIN 4.4  --   --    Ct Head Wo Contrast  Result Date: 07/21/2017 CLINICAL DATA:  Episode of aphasia this morning lasting about 15 minutes. EXAM: CT HEAD WITHOUT CONTRAST TECHNIQUE: Contiguous axial images were obtained from the base of the skull through the vertex without intravenous contrast. COMPARISON:  12/04/2016 FINDINGS: Brain: Age related atrophy. No sign of old or acute focal infarction, mass lesion, hemorrhage, hydrocephalus or extra-axial collection. Vascular: There is atherosclerotic calcification of the major vessels at the base of the brain. Skull: Normal Sinuses/Orbits: Complete opacification of the maxillary sinuses which are poorly developed. This is a chronic finding. Ethmoid, sphenoid and frontal sinuses are clear. Frontal sinuses are diminutive. Orbits negative. Mastoids clear. Other: None IMPRESSION: Age related atrophy.  No acute or focal finding otherwise. Electronically Signed   By: Paulina Fusi M.D.   On: 07/21/2017 11:49   Mr Maxine Glenn Head Wo Contrast  Result Date: 07/21/2017 CLINICAL DATA:  Transient expressive dysphasia. Frontal headache. History of atrial fibrillation on Eliquis, TIA. EXAM: MRI HEAD WITHOUT AND WITH CONTRAST MRA  HEAD WITHOUT CONTRAST MRI NECK WITHOUT AND WITH CONTRAST TECHNIQUE: Multiplanar, multiecho pulse sequences of the brain and surrounding structures were obtained without and with intravenous contrast. Angiographic images of the head were obtained using MRA technique without contrast. Multiplanar, multiecho pulse sequences of the neck and surrounding structures were obtained  without and with intravenous contrast. CONTRAST:  18mL MULTIHANCE GADOBENATE DIMEGLUMINE 529 MG/ML IV SOLN COMPARISON:  CT HEAD July 21, 2017 at 1145 hours and CT cervical spine December 04, 2016 and MRI/MRA head August 25, 2011 FINDINGS: MRI HEAD FINDINGS INTRACRANIAL CONTENTS: No reduced diffusion to suggest acute ischemia. No susceptibility artifact to suggest hemorrhage. The ventricles and sulci are normal for patient's age. No suspicious parenchymal signal, masses, mass effect. A few scattered subcentimeter supratentorial white matter FLAIR T2 hyperintensities compatible with mild chronic small vessel ischemic disease, less than expected for age. No abnormal intraparenchymal or extra-axial enhancement. No abnormal extra-axial fluid collections. No extra-axial masses. VASCULAR: Normal major intracranial vascular flow voids present at skull base. SKULL AND UPPER CERVICAL SPINE: No abnormal sellar expansion. No suspicious calvarial bone marrow signal. Craniocervical junction maintained. SINUSES/ORBITS: Pan paranasal sinusitis with atretic maxillary sinuses compatible with chronic sinusitis. The included ocular globes and orbital contents are non-suspicious. OTHER: Patient is edentulous. MRA HEAD FINDINGS ANTERIOR CIRCULATION: Normal flow related enhancement of the included cervical, petrous, cavernous and supraclinoid internal carotid arteries. Patent anterior communicating artery. Patent anterior and middle cerebral arteries, including distal segments. No large vessel occlusion, high-grade stenosis, aneurysm. POSTERIOR CIRCULATION: LEFT vertebral artery is dominant. Basilar artery is patent, with normal flow related enhancement of the main branch vessels. Patent posterior cerebral arteries. No large vessel occlusion, high-grade stenosis, aneurysm. ANATOMIC VARIANTS: Hypoplastic RIGHT A1 segment. Source images and MIP images were reviewed. MRI NECK FINDINGS ANTERIOR CIRCULATION: The common carotid arteries  are widely patent bilaterally. The carotid bifurcations are patent bilaterally without hemodynamically significant stenosis by NASCET criteria. No flow limiting stenosis or luminal irregularity. POSTERIOR CIRCULATION: Bilateral vertebral arteries are patent to the vertebrobasilar junction. LEFT vertebral artery is dominant. No flow limiting stenosis or luminal irregularity. Source images and MIP images were reviewed. IMPRESSION: Negative MRI of the head with and without contrast for age. Chronic sinusitis. Negative MRA of the head. Negative MRA of the neck. Electronically Signed   By: Awilda Metro M.D.   On: 07/21/2017 22:53   Mr Maxine Glenn Neck W Wo Contrast  Result Date: 07/21/2017 CLINICAL DATA:  Transient expressive dysphasia. Frontal headache. History of atrial fibrillation on Eliquis, TIA. EXAM: MRI HEAD WITHOUT AND WITH CONTRAST MRA HEAD WITHOUT CONTRAST MRI NECK WITHOUT AND WITH CONTRAST TECHNIQUE: Multiplanar, multiecho pulse sequences of the brain and surrounding structures were obtained without and with intravenous contrast. Angiographic images of the head were obtained using MRA technique without contrast. Multiplanar, multiecho pulse sequences of the neck and surrounding structures were obtained without and with intravenous contrast. CONTRAST:  18mL MULTIHANCE GADOBENATE DIMEGLUMINE 529 MG/ML IV SOLN COMPARISON:  CT HEAD July 21, 2017 at 1145 hours and CT cervical spine December 04, 2016 and MRI/MRA head August 25, 2011 FINDINGS: MRI HEAD FINDINGS INTRACRANIAL CONTENTS: No reduced diffusion to suggest acute ischemia. No susceptibility artifact to suggest hemorrhage. The ventricles and sulci are normal for patient's age. No suspicious parenchymal signal, masses, mass effect. A few scattered subcentimeter supratentorial white matter FLAIR T2 hyperintensities compatible with mild chronic small vessel ischemic disease, less than expected for age. No abnormal intraparenchymal or extra-axial  enhancement. No abnormal extra-axial fluid collections. No extra-axial masses.  VASCULAR: Normal major intracranial vascular flow voids present at skull base. SKULL AND UPPER CERVICAL SPINE: No abnormal sellar expansion. No suspicious calvarial bone marrow signal. Craniocervical junction maintained. SINUSES/ORBITS: Pan paranasal sinusitis with atretic maxillary sinuses compatible with chronic sinusitis. The included ocular globes and orbital contents are non-suspicious. OTHER: Patient is edentulous. MRA HEAD FINDINGS ANTERIOR CIRCULATION: Normal flow related enhancement of the included cervical, petrous, cavernous and supraclinoid internal carotid arteries. Patent anterior communicating artery. Patent anterior and middle cerebral arteries, including distal segments. No large vessel occlusion, high-grade stenosis, aneurysm. POSTERIOR CIRCULATION: LEFT vertebral artery is dominant. Basilar artery is patent, with normal flow related enhancement of the main branch vessels. Patent posterior cerebral arteries. No large vessel occlusion, high-grade stenosis, aneurysm. ANATOMIC VARIANTS: Hypoplastic RIGHT A1 segment. Source images and MIP images were reviewed. MRI NECK FINDINGS ANTERIOR CIRCULATION: The common carotid arteries are widely patent bilaterally. The carotid bifurcations are patent bilaterally without hemodynamically significant stenosis by NASCET criteria. No flow limiting stenosis or luminal irregularity. POSTERIOR CIRCULATION: Bilateral vertebral arteries are patent to the vertebrobasilar junction. LEFT vertebral artery is dominant. No flow limiting stenosis or luminal irregularity. Source images and MIP images were reviewed. IMPRESSION: Negative MRI of the head with and without contrast for age. Chronic sinusitis. Negative MRA of the head. Negative MRA of the neck. Electronically Signed   By: Awilda Metro M.D.   On: 07/21/2017 22:53   Mr Laqueta Jean ZO Contrast  Result Date: 07/21/2017 CLINICAL DATA:   Transient expressive dysphasia. Frontal headache. History of atrial fibrillation on Eliquis, TIA. EXAM: MRI HEAD WITHOUT AND WITH CONTRAST MRA HEAD WITHOUT CONTRAST MRI NECK WITHOUT AND WITH CONTRAST TECHNIQUE: Multiplanar, multiecho pulse sequences of the brain and surrounding structures were obtained without and with intravenous contrast. Angiographic images of the head were obtained using MRA technique without contrast. Multiplanar, multiecho pulse sequences of the neck and surrounding structures were obtained without and with intravenous contrast. CONTRAST:  18mL MULTIHANCE GADOBENATE DIMEGLUMINE 529 MG/ML IV SOLN COMPARISON:  CT HEAD July 21, 2017 at 1145 hours and CT cervical spine December 04, 2016 and MRI/MRA head August 25, 2011 FINDINGS: MRI HEAD FINDINGS INTRACRANIAL CONTENTS: No reduced diffusion to suggest acute ischemia. No susceptibility artifact to suggest hemorrhage. The ventricles and sulci are normal for patient's age. No suspicious parenchymal signal, masses, mass effect. A few scattered subcentimeter supratentorial white matter FLAIR T2 hyperintensities compatible with mild chronic small vessel ischemic disease, less than expected for age. No abnormal intraparenchymal or extra-axial enhancement. No abnormal extra-axial fluid collections. No extra-axial masses. VASCULAR: Normal major intracranial vascular flow voids present at skull base. SKULL AND UPPER CERVICAL SPINE: No abnormal sellar expansion. No suspicious calvarial bone marrow signal. Craniocervical junction maintained. SINUSES/ORBITS: Pan paranasal sinusitis with atretic maxillary sinuses compatible with chronic sinusitis. The included ocular globes and orbital contents are non-suspicious. OTHER: Patient is edentulous. MRA HEAD FINDINGS ANTERIOR CIRCULATION: Normal flow related enhancement of the included cervical, petrous, cavernous and supraclinoid internal carotid arteries. Patent anterior communicating artery. Patent anterior  and middle cerebral arteries, including distal segments. No large vessel occlusion, high-grade stenosis, aneurysm. POSTERIOR CIRCULATION: LEFT vertebral artery is dominant. Basilar artery is patent, with normal flow related enhancement of the main branch vessels. Patent posterior cerebral arteries. No large vessel occlusion, high-grade stenosis, aneurysm. ANATOMIC VARIANTS: Hypoplastic RIGHT A1 segment. Source images and MIP images were reviewed. MRI NECK FINDINGS ANTERIOR CIRCULATION: The common carotid arteries are widely patent bilaterally. The carotid bifurcations are patent bilaterally without hemodynamically significant stenosis by  NASCET criteria. No flow limiting stenosis or luminal irregularity. POSTERIOR CIRCULATION: Bilateral vertebral arteries are patent to the vertebrobasilar junction. LEFT vertebral artery is dominant. No flow limiting stenosis or luminal irregularity. Source images and MIP images were reviewed. IMPRESSION: Negative MRI of the head with and without contrast for age. Chronic sinusitis. Negative MRA of the head. Negative MRA of the neck. Electronically Signed   By: Awilda Metro M.D.   On: 07/21/2017 22:53   Echo - EF 60-65%, no regional wall motion; PA peak pressure 37 mmHg  Results/Tests Pending at Time of Discharge: none  Discharge Medications:  Allergies as of 07/22/2017   No Known Allergies     Medication List    STOP taking these medications   amLODipine 10 MG tablet Commonly known as:  NORVASC   valsartan-hydrochlorothiazide 320-12.5 MG tablet Commonly known as:  DIOVAN-HCT     TAKE these medications   acetaminophen 500 MG tablet Commonly known as:  TYLENOL Take 500 mg by mouth every 6 (six) hours as needed for mild pain.   apixaban 5 MG Tabs tablet Commonly known as:  ELIQUIS Take 5 mg by mouth 2 (two) times daily.   ICY HOT EX Apply 1 application topically as needed (Joint pain).            Discharge Care Instructions        Start      Ordered   07/22/17 0000  Increase activity slowly     07/22/17 1224   07/22/17 0000  Diet - low sodium heart healthy     07/22/17 1224   07/22/17 0000  Discharge instructions    Comments:  Please make an appt with your regular doctor in the next 2 weeks to discuss your TIA and whether you should start a cholesterol medication. We did not restart your blood pressure medications on discharge because your blood pressure is normal, but if you check your blood pressure at home and it is elevated you can call your regular doctor and discuss restarting them.   07/22/17 1224      Discharge Instructions: Please refer to Patient Instructions section of EMR for full details.  Patient was counseled important signs and symptoms that should prompt return to medical care, changes in medications, dietary instructions, activity restrictions, and follow up appointments.   Follow-Up Appointments: Follow-up Information    Hadley Pen, MD. Schedule an appointment as soon as possible for a visit in 2 week(s).   Specialty:  Family Medicine Why:  to discuss cholesterol medicine and blood pressure Contact information: [address] [place] [address] [phone]           Garth Bigness, MD 07/22/2017, 12:24 PM PGY-2, [place] [facility]

## 2017-07-30 DIAGNOSIS — E559 Vitamin D deficiency, unspecified: Secondary | ICD-10-CM | POA: Diagnosis not present

## 2017-07-30 DIAGNOSIS — E1165 Type 2 diabetes mellitus with hyperglycemia: Secondary | ICD-10-CM | POA: Diagnosis not present

## 2017-07-30 DIAGNOSIS — Z0000 Encounter for general adult medical examination without abnormal findings: Secondary | ICD-10-CM | POA: Diagnosis not present

## 2017-07-30 DIAGNOSIS — Z23 Encounter for immunization: Secondary | ICD-10-CM | POA: Diagnosis not present

## 2017-07-30 DIAGNOSIS — I10 Essential (primary) hypertension: Secondary | ICD-10-CM | POA: Diagnosis not present

## 2017-07-30 DIAGNOSIS — E782 Mixed hyperlipidemia: Secondary | ICD-10-CM | POA: Diagnosis not present

## 2017-08-11 DIAGNOSIS — S335XXA Sprain of ligaments of lumbar spine, initial encounter: Secondary | ICD-10-CM | POA: Diagnosis not present

## 2017-09-06 DIAGNOSIS — M5137 Other intervertebral disc degeneration, lumbosacral region: Secondary | ICD-10-CM | POA: Diagnosis not present

## 2017-09-06 DIAGNOSIS — M9903 Segmental and somatic dysfunction of lumbar region: Secondary | ICD-10-CM | POA: Diagnosis not present

## 2017-09-06 DIAGNOSIS — M5136 Other intervertebral disc degeneration, lumbar region: Secondary | ICD-10-CM | POA: Diagnosis not present

## 2017-09-06 DIAGNOSIS — M9904 Segmental and somatic dysfunction of sacral region: Secondary | ICD-10-CM | POA: Diagnosis not present

## 2017-09-06 DIAGNOSIS — M5442 Lumbago with sciatica, left side: Secondary | ICD-10-CM | POA: Diagnosis not present

## 2017-09-10 DIAGNOSIS — M5137 Other intervertebral disc degeneration, lumbosacral region: Secondary | ICD-10-CM | POA: Diagnosis not present

## 2017-09-10 DIAGNOSIS — M5442 Lumbago with sciatica, left side: Secondary | ICD-10-CM | POA: Diagnosis not present

## 2017-09-10 DIAGNOSIS — M9904 Segmental and somatic dysfunction of sacral region: Secondary | ICD-10-CM | POA: Diagnosis not present

## 2017-09-10 DIAGNOSIS — M9903 Segmental and somatic dysfunction of lumbar region: Secondary | ICD-10-CM | POA: Diagnosis not present

## 2017-09-10 DIAGNOSIS — M5136 Other intervertebral disc degeneration, lumbar region: Secondary | ICD-10-CM | POA: Diagnosis not present

## 2017-09-12 DIAGNOSIS — M5137 Other intervertebral disc degeneration, lumbosacral region: Secondary | ICD-10-CM | POA: Diagnosis not present

## 2017-09-12 DIAGNOSIS — M5136 Other intervertebral disc degeneration, lumbar region: Secondary | ICD-10-CM | POA: Diagnosis not present

## 2017-09-12 DIAGNOSIS — M9903 Segmental and somatic dysfunction of lumbar region: Secondary | ICD-10-CM | POA: Diagnosis not present

## 2017-09-12 DIAGNOSIS — M5442 Lumbago with sciatica, left side: Secondary | ICD-10-CM | POA: Diagnosis not present

## 2017-09-12 DIAGNOSIS — M9904 Segmental and somatic dysfunction of sacral region: Secondary | ICD-10-CM | POA: Diagnosis not present

## 2017-09-19 DIAGNOSIS — M5442 Lumbago with sciatica, left side: Secondary | ICD-10-CM | POA: Diagnosis not present

## 2017-09-19 DIAGNOSIS — M9904 Segmental and somatic dysfunction of sacral region: Secondary | ICD-10-CM | POA: Diagnosis not present

## 2017-09-19 DIAGNOSIS — M9903 Segmental and somatic dysfunction of lumbar region: Secondary | ICD-10-CM | POA: Diagnosis not present

## 2017-09-19 DIAGNOSIS — M5137 Other intervertebral disc degeneration, lumbosacral region: Secondary | ICD-10-CM | POA: Diagnosis not present

## 2017-09-19 DIAGNOSIS — M5136 Other intervertebral disc degeneration, lumbar region: Secondary | ICD-10-CM | POA: Diagnosis not present

## 2017-09-21 DIAGNOSIS — M9904 Segmental and somatic dysfunction of sacral region: Secondary | ICD-10-CM | POA: Diagnosis not present

## 2017-09-21 DIAGNOSIS — M5136 Other intervertebral disc degeneration, lumbar region: Secondary | ICD-10-CM | POA: Diagnosis not present

## 2017-09-21 DIAGNOSIS — M5442 Lumbago with sciatica, left side: Secondary | ICD-10-CM | POA: Diagnosis not present

## 2017-09-21 DIAGNOSIS — M9903 Segmental and somatic dysfunction of lumbar region: Secondary | ICD-10-CM | POA: Diagnosis not present

## 2017-09-21 DIAGNOSIS — M5137 Other intervertebral disc degeneration, lumbosacral region: Secondary | ICD-10-CM | POA: Diagnosis not present

## 2017-09-24 DIAGNOSIS — M9904 Segmental and somatic dysfunction of sacral region: Secondary | ICD-10-CM | POA: Diagnosis not present

## 2017-09-24 DIAGNOSIS — M5442 Lumbago with sciatica, left side: Secondary | ICD-10-CM | POA: Diagnosis not present

## 2017-09-24 DIAGNOSIS — M5136 Other intervertebral disc degeneration, lumbar region: Secondary | ICD-10-CM | POA: Diagnosis not present

## 2017-09-24 DIAGNOSIS — M9903 Segmental and somatic dysfunction of lumbar region: Secondary | ICD-10-CM | POA: Diagnosis not present

## 2017-09-24 DIAGNOSIS — M5137 Other intervertebral disc degeneration, lumbosacral region: Secondary | ICD-10-CM | POA: Diagnosis not present

## 2017-09-25 DIAGNOSIS — M5136 Other intervertebral disc degeneration, lumbar region: Secondary | ICD-10-CM | POA: Diagnosis not present

## 2017-09-25 DIAGNOSIS — M5442 Lumbago with sciatica, left side: Secondary | ICD-10-CM | POA: Diagnosis not present

## 2017-09-25 DIAGNOSIS — M5137 Other intervertebral disc degeneration, lumbosacral region: Secondary | ICD-10-CM | POA: Diagnosis not present

## 2017-09-25 DIAGNOSIS — M9904 Segmental and somatic dysfunction of sacral region: Secondary | ICD-10-CM | POA: Diagnosis not present

## 2017-09-25 DIAGNOSIS — M9903 Segmental and somatic dysfunction of lumbar region: Secondary | ICD-10-CM | POA: Diagnosis not present

## 2017-09-26 DIAGNOSIS — M5442 Lumbago with sciatica, left side: Secondary | ICD-10-CM | POA: Diagnosis not present

## 2017-09-26 DIAGNOSIS — M9903 Segmental and somatic dysfunction of lumbar region: Secondary | ICD-10-CM | POA: Diagnosis not present

## 2017-09-26 DIAGNOSIS — M9904 Segmental and somatic dysfunction of sacral region: Secondary | ICD-10-CM | POA: Diagnosis not present

## 2017-09-26 DIAGNOSIS — M5136 Other intervertebral disc degeneration, lumbar region: Secondary | ICD-10-CM | POA: Diagnosis not present

## 2017-09-26 DIAGNOSIS — M5137 Other intervertebral disc degeneration, lumbosacral region: Secondary | ICD-10-CM | POA: Diagnosis not present

## 2017-10-01 DIAGNOSIS — M5136 Other intervertebral disc degeneration, lumbar region: Secondary | ICD-10-CM | POA: Diagnosis not present

## 2017-10-01 DIAGNOSIS — M5137 Other intervertebral disc degeneration, lumbosacral region: Secondary | ICD-10-CM | POA: Diagnosis not present

## 2017-10-01 DIAGNOSIS — M9903 Segmental and somatic dysfunction of lumbar region: Secondary | ICD-10-CM | POA: Diagnosis not present

## 2017-10-01 DIAGNOSIS — M9904 Segmental and somatic dysfunction of sacral region: Secondary | ICD-10-CM | POA: Diagnosis not present

## 2017-10-01 DIAGNOSIS — M5442 Lumbago with sciatica, left side: Secondary | ICD-10-CM | POA: Diagnosis not present

## 2017-10-03 DIAGNOSIS — M9903 Segmental and somatic dysfunction of lumbar region: Secondary | ICD-10-CM | POA: Diagnosis not present

## 2017-10-03 DIAGNOSIS — M5136 Other intervertebral disc degeneration, lumbar region: Secondary | ICD-10-CM | POA: Diagnosis not present

## 2017-10-03 DIAGNOSIS — M9904 Segmental and somatic dysfunction of sacral region: Secondary | ICD-10-CM | POA: Diagnosis not present

## 2017-10-03 DIAGNOSIS — M5137 Other intervertebral disc degeneration, lumbosacral region: Secondary | ICD-10-CM | POA: Diagnosis not present

## 2017-10-03 DIAGNOSIS — M5442 Lumbago with sciatica, left side: Secondary | ICD-10-CM | POA: Diagnosis not present

## 2017-10-05 DIAGNOSIS — M9904 Segmental and somatic dysfunction of sacral region: Secondary | ICD-10-CM | POA: Diagnosis not present

## 2017-10-05 DIAGNOSIS — M5136 Other intervertebral disc degeneration, lumbar region: Secondary | ICD-10-CM | POA: Diagnosis not present

## 2017-10-05 DIAGNOSIS — M9903 Segmental and somatic dysfunction of lumbar region: Secondary | ICD-10-CM | POA: Diagnosis not present

## 2017-10-05 DIAGNOSIS — M5442 Lumbago with sciatica, left side: Secondary | ICD-10-CM | POA: Diagnosis not present

## 2017-10-05 DIAGNOSIS — M5137 Other intervertebral disc degeneration, lumbosacral region: Secondary | ICD-10-CM | POA: Diagnosis not present

## 2017-10-08 DIAGNOSIS — M9903 Segmental and somatic dysfunction of lumbar region: Secondary | ICD-10-CM | POA: Diagnosis not present

## 2017-10-08 DIAGNOSIS — M5137 Other intervertebral disc degeneration, lumbosacral region: Secondary | ICD-10-CM | POA: Diagnosis not present

## 2017-10-08 DIAGNOSIS — M5442 Lumbago with sciatica, left side: Secondary | ICD-10-CM | POA: Diagnosis not present

## 2017-10-08 DIAGNOSIS — M9904 Segmental and somatic dysfunction of sacral region: Secondary | ICD-10-CM | POA: Diagnosis not present

## 2017-10-08 DIAGNOSIS — M5136 Other intervertebral disc degeneration, lumbar region: Secondary | ICD-10-CM | POA: Diagnosis not present

## 2017-10-10 DIAGNOSIS — M5442 Lumbago with sciatica, left side: Secondary | ICD-10-CM | POA: Diagnosis not present

## 2017-10-10 DIAGNOSIS — M9903 Segmental and somatic dysfunction of lumbar region: Secondary | ICD-10-CM | POA: Diagnosis not present

## 2017-10-10 DIAGNOSIS — M5136 Other intervertebral disc degeneration, lumbar region: Secondary | ICD-10-CM | POA: Diagnosis not present

## 2017-10-10 DIAGNOSIS — M5137 Other intervertebral disc degeneration, lumbosacral region: Secondary | ICD-10-CM | POA: Diagnosis not present

## 2017-10-10 DIAGNOSIS — M9904 Segmental and somatic dysfunction of sacral region: Secondary | ICD-10-CM | POA: Diagnosis not present

## 2017-10-12 DIAGNOSIS — M5137 Other intervertebral disc degeneration, lumbosacral region: Secondary | ICD-10-CM | POA: Diagnosis not present

## 2017-10-12 DIAGNOSIS — M5136 Other intervertebral disc degeneration, lumbar region: Secondary | ICD-10-CM | POA: Diagnosis not present

## 2017-10-12 DIAGNOSIS — M9904 Segmental and somatic dysfunction of sacral region: Secondary | ICD-10-CM | POA: Diagnosis not present

## 2017-10-12 DIAGNOSIS — M9903 Segmental and somatic dysfunction of lumbar region: Secondary | ICD-10-CM | POA: Diagnosis not present

## 2017-10-12 DIAGNOSIS — M5442 Lumbago with sciatica, left side: Secondary | ICD-10-CM | POA: Diagnosis not present

## 2017-10-18 DIAGNOSIS — E162 Hypoglycemia, unspecified: Secondary | ICD-10-CM | POA: Diagnosis not present

## 2017-10-31 DIAGNOSIS — R0981 Nasal congestion: Secondary | ICD-10-CM | POA: Diagnosis not present

## 2017-10-31 DIAGNOSIS — J00 Acute nasopharyngitis [common cold]: Secondary | ICD-10-CM | POA: Diagnosis not present

## 2017-10-31 DIAGNOSIS — R0600 Dyspnea, unspecified: Secondary | ICD-10-CM | POA: Diagnosis not present

## 2017-11-09 DIAGNOSIS — M5136 Other intervertebral disc degeneration, lumbar region: Secondary | ICD-10-CM | POA: Diagnosis not present

## 2017-11-09 DIAGNOSIS — M5442 Lumbago with sciatica, left side: Secondary | ICD-10-CM | POA: Diagnosis not present

## 2017-11-09 DIAGNOSIS — M9903 Segmental and somatic dysfunction of lumbar region: Secondary | ICD-10-CM | POA: Diagnosis not present

## 2017-11-09 DIAGNOSIS — M9904 Segmental and somatic dysfunction of sacral region: Secondary | ICD-10-CM | POA: Diagnosis not present

## 2017-11-09 DIAGNOSIS — M5137 Other intervertebral disc degeneration, lumbosacral region: Secondary | ICD-10-CM | POA: Diagnosis not present

## 2017-12-31 DIAGNOSIS — L219 Seborrheic dermatitis, unspecified: Secondary | ICD-10-CM | POA: Diagnosis not present

## 2018-01-20 DIAGNOSIS — J209 Acute bronchitis, unspecified: Secondary | ICD-10-CM | POA: Diagnosis not present

## 2018-01-28 DIAGNOSIS — I10 Essential (primary) hypertension: Secondary | ICD-10-CM | POA: Diagnosis not present

## 2018-01-28 DIAGNOSIS — I480 Paroxysmal atrial fibrillation: Secondary | ICD-10-CM | POA: Diagnosis not present

## 2018-01-28 DIAGNOSIS — E782 Mixed hyperlipidemia: Secondary | ICD-10-CM | POA: Diagnosis not present

## 2018-01-28 DIAGNOSIS — E1165 Type 2 diabetes mellitus with hyperglycemia: Secondary | ICD-10-CM | POA: Diagnosis not present

## 2018-01-28 DIAGNOSIS — I129 Hypertensive chronic kidney disease with stage 1 through stage 4 chronic kidney disease, or unspecified chronic kidney disease: Secondary | ICD-10-CM | POA: Diagnosis not present

## 2018-01-28 DIAGNOSIS — E1122 Type 2 diabetes mellitus with diabetic chronic kidney disease: Secondary | ICD-10-CM | POA: Diagnosis not present

## 2018-05-24 DIAGNOSIS — H353231 Exudative age-related macular degeneration, bilateral, with active choroidal neovascularization: Secondary | ICD-10-CM | POA: Diagnosis not present

## 2018-06-21 DIAGNOSIS — H353231 Exudative age-related macular degeneration, bilateral, with active choroidal neovascularization: Secondary | ICD-10-CM | POA: Diagnosis not present

## 2018-06-21 DIAGNOSIS — H401133 Primary open-angle glaucoma, bilateral, severe stage: Secondary | ICD-10-CM | POA: Diagnosis not present

## 2018-07-02 DIAGNOSIS — R319 Hematuria, unspecified: Secondary | ICD-10-CM | POA: Diagnosis not present

## 2018-07-02 DIAGNOSIS — R1032 Left lower quadrant pain: Secondary | ICD-10-CM | POA: Diagnosis not present

## 2018-07-02 DIAGNOSIS — K5900 Constipation, unspecified: Secondary | ICD-10-CM | POA: Diagnosis not present

## 2018-07-05 DIAGNOSIS — R0602 Shortness of breath: Secondary | ICD-10-CM | POA: Diagnosis not present

## 2018-07-05 DIAGNOSIS — E119 Type 2 diabetes mellitus without complications: Secondary | ICD-10-CM | POA: Diagnosis not present

## 2018-07-05 DIAGNOSIS — R109 Unspecified abdominal pain: Secondary | ICD-10-CM | POA: Diagnosis not present

## 2018-07-05 DIAGNOSIS — R1032 Left lower quadrant pain: Secondary | ICD-10-CM | POA: Diagnosis not present

## 2018-07-05 DIAGNOSIS — I10 Essential (primary) hypertension: Secondary | ICD-10-CM | POA: Diagnosis not present

## 2018-07-27 DIAGNOSIS — Z23 Encounter for immunization: Secondary | ICD-10-CM | POA: Diagnosis not present

## 2018-08-01 DIAGNOSIS — H353231 Exudative age-related macular degeneration, bilateral, with active choroidal neovascularization: Secondary | ICD-10-CM | POA: Diagnosis not present

## 2018-08-02 DIAGNOSIS — H401133 Primary open-angle glaucoma, bilateral, severe stage: Secondary | ICD-10-CM | POA: Diagnosis not present

## 2018-08-09 DIAGNOSIS — E559 Vitamin D deficiency, unspecified: Secondary | ICD-10-CM | POA: Diagnosis not present

## 2018-08-09 DIAGNOSIS — I517 Cardiomegaly: Secondary | ICD-10-CM | POA: Diagnosis not present

## 2018-08-09 DIAGNOSIS — E782 Mixed hyperlipidemia: Secondary | ICD-10-CM | POA: Diagnosis not present

## 2018-08-09 DIAGNOSIS — E1165 Type 2 diabetes mellitus with hyperglycemia: Secondary | ICD-10-CM | POA: Diagnosis not present

## 2018-08-09 DIAGNOSIS — E1122 Type 2 diabetes mellitus with diabetic chronic kidney disease: Secondary | ICD-10-CM | POA: Diagnosis not present

## 2018-08-09 DIAGNOSIS — I129 Hypertensive chronic kidney disease with stage 1 through stage 4 chronic kidney disease, or unspecified chronic kidney disease: Secondary | ICD-10-CM | POA: Diagnosis not present

## 2018-08-09 DIAGNOSIS — I1310 Hypertensive heart and chronic kidney disease without heart failure, with stage 1 through stage 4 chronic kidney disease, or unspecified chronic kidney disease: Secondary | ICD-10-CM | POA: Diagnosis not present

## 2018-08-09 DIAGNOSIS — N183 Chronic kidney disease, stage 3 (moderate): Secondary | ICD-10-CM | POA: Diagnosis not present

## 2018-08-09 DIAGNOSIS — Z0001 Encounter for general adult medical examination with abnormal findings: Secondary | ICD-10-CM | POA: Diagnosis not present

## 2018-09-05 DIAGNOSIS — H353211 Exudative age-related macular degeneration, right eye, with active choroidal neovascularization: Secondary | ICD-10-CM | POA: Diagnosis not present

## 2018-09-08 DIAGNOSIS — R05 Cough: Secondary | ICD-10-CM | POA: Diagnosis not present

## 2018-09-25 ENCOUNTER — Ambulatory Visit: Payer: Medicare Other | Admitting: Cardiology

## 2018-10-07 DIAGNOSIS — H353211 Exudative age-related macular degeneration, right eye, with active choroidal neovascularization: Secondary | ICD-10-CM | POA: Diagnosis not present

## 2018-10-31 DIAGNOSIS — R51 Headache: Secondary | ICD-10-CM | POA: Diagnosis not present

## 2018-10-31 DIAGNOSIS — R1084 Generalized abdominal pain: Secondary | ICD-10-CM | POA: Diagnosis not present

## 2018-11-05 ENCOUNTER — Encounter: Payer: Self-pay | Admitting: Cardiology

## 2018-11-05 DIAGNOSIS — I4891 Unspecified atrial fibrillation: Secondary | ICD-10-CM | POA: Diagnosis not present

## 2018-11-05 DIAGNOSIS — I10 Essential (primary) hypertension: Secondary | ICD-10-CM | POA: Diagnosis not present

## 2018-11-05 DIAGNOSIS — Z7902 Long term (current) use of antithrombotics/antiplatelets: Secondary | ICD-10-CM | POA: Diagnosis not present

## 2018-11-05 DIAGNOSIS — R531 Weakness: Secondary | ICD-10-CM | POA: Diagnosis not present

## 2018-11-05 DIAGNOSIS — Z87891 Personal history of nicotine dependence: Secondary | ICD-10-CM | POA: Diagnosis not present

## 2018-11-05 DIAGNOSIS — R0989 Other specified symptoms and signs involving the circulatory and respiratory systems: Secondary | ICD-10-CM | POA: Diagnosis not present

## 2018-11-14 DIAGNOSIS — H353211 Exudative age-related macular degeneration, right eye, with active choroidal neovascularization: Secondary | ICD-10-CM | POA: Diagnosis not present

## 2018-11-18 ENCOUNTER — Ambulatory Visit (INDEPENDENT_AMBULATORY_CARE_PROVIDER_SITE_OTHER): Payer: Medicare Other | Admitting: Cardiology

## 2018-11-18 ENCOUNTER — Encounter: Payer: Self-pay | Admitting: Cardiology

## 2018-11-18 VITALS — BP 138/80 | HR 71 | Ht 75.5 in | Wt 210.8 lb

## 2018-11-18 DIAGNOSIS — G451 Carotid artery syndrome (hemispheric): Secondary | ICD-10-CM | POA: Diagnosis not present

## 2018-11-18 DIAGNOSIS — Z7901 Long term (current) use of anticoagulants: Secondary | ICD-10-CM | POA: Diagnosis not present

## 2018-11-18 DIAGNOSIS — I447 Left bundle-branch block, unspecified: Secondary | ICD-10-CM | POA: Insufficient documentation

## 2018-11-18 DIAGNOSIS — I4821 Permanent atrial fibrillation: Secondary | ICD-10-CM | POA: Diagnosis not present

## 2018-11-18 NOTE — Patient Instructions (Signed)
Medication Instructions:  Your physician recommends that you continue on your current medications as directed. Please refer to the Current Medication list given to you today.  If you need a refill on your cardiac medications before your next appointment, please call your pharmacy.   Lab work: None  If you have labs (blood work) drawn today and your tests are completely normal, you will receive your results only by: Marland Kitchen MyChart Message (if you have MyChart) OR . A paper copy in the mail If you have any lab test that is abnormal or we need to change your treatment, we will call you to review the results.  Testing/Procedures: Your physician has requested that you have an echocardiogram. Echocardiography is a painless test that uses sound waves to create images of your heart. It provides your doctor with information about the size and shape of your heart and how well your heart's chambers and valves are working. This procedure takes approximately one hour. There are no restrictions for this procedure.  Your physician has requested that you have a carotid duplex. This test is an ultrasound of the carotid arteries in your neck. It looks at blood flow through these arteries that supply the brain with blood. Allow one hour for this exam. There are no restrictions or special instructions.  Your physician has recommended that you wear a holter monitor. Holter monitors are medical devices that record the heart's electrical activity. Doctors most often use these monitors to diagnose arrhythmias. Arrhythmias are problems with the speed or rhythm of the heartbeat. The monitor is a small, portable device. You can wear one while you do your normal daily activities. This is usually used to diagnose what is causing palpitations/syncope (passing out). 48 hour monitor  EKG Today  Follow-Up: At [facility], you and your health needs are our priority.  As part of our continuing mission to provide you with  exceptional heart care, we have created designated Provider Care Teams.  These Care Teams include your primary Cardiologist (physician) and Advanced Practice Providers (APPs -  Physician Assistants and Nurse Practitioners) who all work together to provide you with the care you need, when you need it. You will need a follow up appointment in 1 months.    You may see  Gypsy Balsam or another member of our [employer] Provider Team in Secretary: Norman Herrlich, MD . Belva Crome, MD  Any Other Special Instructions Will Be Listed Below (If Applicable).

## 2018-11-18 NOTE — Progress Notes (Signed)
Cardiology Consultation:    Date:  11/18/2018   ID:  Adrian DillonJames G Stouffer, DOB 08/11/1931, MRN [id number]  PCP:  Hadley Penobbins, Robert A, MD  Cardiologist:  Gypsy Balsamobert Krasowski, MD   Referring MD: Hadley Penobbins, Robert A, MD   Chief Complaint  Patient presents with  . Follow up from ER  . Atrial Fibrillation  I had atrial fibrillation recently I was in the emergency room  History of Present Illness:    Adrian Hill is a 85 y.o. male who is being seen today for the evaluation of atrial fibrillation at the request of Hadley Penobbins, Robert A, MD.  He does have chronic atrial fibrillation chronically anticoagulated his daughter is a nurse and she reports that sometimes when she check his heart rate his heart rate will be slow 48 matter-of-fact he ended going to the emergency room twice the first time at the end of December because of dehydration he also got slurred speech of the time recovered completely from it however yesterday they called EMS because his heart rate was very fast about 160 EMS came but however he was not taken to the emergency room.  Today he is doing fine his EKG showing atrial fibrillation which is chronic left bundle branch block which is chronic with heart rate of 70-75.  Denies having any chest pain tightness squeezing pressure burning chest he does have some heaviness in his shoulder sometimes he is still trying to be active but does have macular degeneration would make the situation somewhat difficult.  He used to have plenty of tomato plants up to 175 last year only 120 and he is planning to cut down on it.  Denies having any typical chest pain tightness squeezing pressure burning chest does complain of having some dizziness when he walks.  But no passing out.  Past Medical History:  Diagnosis Date  . Acid reflux   . BBB (bundle branch block)    Left BBB  . Diabetes (HCC)   . HTN (hypertension)   . Kidney stones   . Paroxysmal A-fib (HCC)   . TIA (transient ischemic attack)        Current Medications: Current Meds  Medication Sig  . apixaban (ELIQUIS) 5 MG TABS tablet Take 5 mg by mouth 2 (two) times daily.  Marland Kitchen. latanoprost (XALATAN) 0.005 % ophthalmic solution Place 1 drop into both eyes daily.  . Multiple Vitamins-Minerals (ICAPS AREDS 2 PO) Take 2 tablets by mouth daily.  . polyethylene glycol (MIRALAX / GLYCOLAX) packet Take 17 g by mouth daily.  . tamsulosin (FLOMAX) 0.4 MG CAPS capsule Take 0.4 mg by mouth daily after supper.  . valsartan-hydrochlorothiazide (DIOVAN-HCT) 320-12.5 MG tablet Take 1 tablet by mouth daily.     Allergies:   Patient has no known allergies.   Social History   Socioeconomic History  . Marital status: Widowed    Spouse name: Not on file  . Number of children: Not on file  . Years of education: Not on file  . Highest education level: Not on file  Occupational History  . Not on file  Social Needs  . Financial resource strain: Not on file  . Food insecurity:    Worry: Not on file    Inability: Not on file  . Transportation needs:    Medical: Not on file    Non-medical: Not on file  Tobacco Use  . Smoking status: Former Smoker    Types: Cigars    Last attempt to quit: 1996  Years since quitting: 24.0  . Smokeless tobacco: Never Used  Substance and Sexual Activity  . Alcohol use: No  . Drug use: No  . Sexual activity: Not on file  Lifestyle  . Physical activity:    Days per week: Not on file    Minutes per session: Not on file  . Stress: Not on file  Relationships  . Social connections:    Talks on phone: Not on file    Gets together: Not on file    Attends religious service: Not on file    Active member of club or organization: Not on file    Attends meetings of clubs or organizations: Not on file    Relationship status: Not on file  Other Topics Concern  . Not on file  Social History Narrative  . Not on file     Family History: The patient's family history includes Benign prostatic hyperplasia  in his brother; Cancer in his father; Diabetes in his mother; Heart disease in his mother; Hypertension in his brother. ROS:   Please see the history of present illness.    All 14 point review of systems negative except as described per history of present illness.  EKGs/Labs/Other Studies Reviewed:    The following studies were reviewed today:   EKG:  EKG is  ordered today.  The ekg ordered today demonstrates atrial fibrillation controlled ventricular rate left bundle branch block  Recent Labs: No results found for requested labs within last 8760 hours.  Recent Lipid Panel    Component Value Date/Time   CHOL 141 07/22/2017 0554   TRIG 83 07/22/2017 0554   HDL 37 (L) 07/22/2017 0554   CHOLHDL 3.8 07/22/2017 0554   VLDL 17 07/22/2017 0554   LDLCALC 87 07/22/2017 0554    Physical Exam:    VS:  BP 138/80   Pulse 71   Ht 6' 3.5" (1.918 m)   Wt 210 lb 12.8 oz (95.6 kg)   SpO2 97%   BMI 26.00 kg/m     Wt Readings from Last 3 Encounters:  11/18/18 210 lb 12.8 oz (95.6 kg)  07/21/17 191 lb 3.2 oz (86.7 kg)     GEN:  Well nourished, well developed in no acute distress HEENT: Normal NECK: No JVD; No carotid bruits LYMPHATICS: No lymphadenopathy CARDIAC: Irregularly irregular, no murmurs, no rubs, no gallops RESPIRATORY:  Clear to auscultation without rales, wheezing or rhonchi  ABDOMEN: Soft, non-tender, non-distended MUSCULOSKELETAL:  No edema; No deformity  SKIN: Warm and dry NEUROLOGIC:  Alert and oriented x 3 PSYCHIATRIC:  Normal affect   ASSESSMENT:    1. Hemispheric carotid artery syndrome   2. Left bundle branch block   3. Permanent atrial fibrillation   4. Current use of long term anticoagulation    PLAN:    In order of problems listed above:  1. TIAs I will ask him to have carotid ultrasound.  Echocardiogram also will be scheduled he is anticoagulated with Eliquis because of atrial fibrillation with high chads 2 Vascor.  In terms of his heart rate being  slow I will ask him to wear 48 hours Holter monitor to see exactly what, prior variability with dealing with.  He may require pacemaker if he truly does have tachybradycardia behavior. 2. Left bundle branch block chronic we will get echocardiogram to assess left ventricular ejection fraction. 3. Permanent atrial fibrillation continue anticoagulation rate appears to be controlled for now 4. Essential hypertension blood pressure well controlled with appropriate medications which  I will continue 5. Long-term use of anticoagulation will continue.   Medication Adjustments/Labs and Tests Ordered: Current medicines are reviewed at length with the patient today.  Concerns regarding medicines are outlined above.  No orders of the defined types were placed in this encounter.  No orders of the defined types were placed in this encounter.   Signed, Georgeanna Lea, MD, [facility]. 11/18/2018 11:27 AM    [facility]

## 2018-11-19 ENCOUNTER — Ambulatory Visit: Payer: Medicare Other | Admitting: Cardiology

## 2018-11-20 ENCOUNTER — Ambulatory Visit (INDEPENDENT_AMBULATORY_CARE_PROVIDER_SITE_OTHER): Payer: Medicare Other

## 2018-11-20 DIAGNOSIS — I482 Chronic atrial fibrillation, unspecified: Secondary | ICD-10-CM | POA: Diagnosis not present

## 2018-11-20 DIAGNOSIS — I4821 Permanent atrial fibrillation: Secondary | ICD-10-CM

## 2018-11-26 ENCOUNTER — Ambulatory Visit (HOSPITAL_BASED_OUTPATIENT_CLINIC_OR_DEPARTMENT_OTHER)
Admission: RE | Admit: 2018-11-26 | Discharge: 2018-11-26 | Disposition: A | Discharge status: Home / Self Care | Payer: Medicare Other | Source: Ambulatory Visit | Attending: Cardiology | Admitting: Cardiology

## 2018-11-26 DIAGNOSIS — G451 Carotid artery syndrome (hemispheric): Secondary | ICD-10-CM | POA: Diagnosis not present

## 2018-11-26 DIAGNOSIS — I4821 Permanent atrial fibrillation: Secondary | ICD-10-CM

## 2018-11-26 NOTE — Progress Notes (Signed)
Carotid Ultrasound Duplex Performed Bilaterally   Right Carotid:Velocities in the right ICA are consistent with a 1-39% stenosis. Moderate focal plaque visualized at right proximal ICA, Velocity mabe under estimated due to cardiac disease.   Left Carotid Velocities in the left ICA are consistent with a 1-39% stenosis. The ECA appears <50% stenosed. Moderate focal plaque visualized at left proximal ICA, Velocity mabe under estimated due to cardiac disease.     Bilateral vertebral arteries demonstrate antegrade flow.   Normal flow hemodynamics were seen in bilateral subclavian arteries.   11/26/18 Sinda Du RDCS, RVT

## 2018-11-26 NOTE — Progress Notes (Signed)
  Echocardiogram 2D Echocardiogram has been performed.  Adrian Hill 11/26/2018, 11:36 AM

## 2018-11-27 ENCOUNTER — Telehealth: Payer: Self-pay

## 2018-11-27 NOTE — Telephone Encounter (Signed)
-----   Message from Baldo DaubBrian J Munley, MD sent at 11/26/2018  4:57 PM EST ----- Normal or stable result  Overall good report he has mild atrial enlargement mild valvular regurgitation but no severe abnormalities are identified.  Please review this with Adrian Maduroobert when he returns

## 2018-11-27 NOTE — Telephone Encounter (Signed)
Patient called and notified of test results. 

## 2018-11-29 ENCOUNTER — Telehealth: Payer: Self-pay | Admitting: Cardiology

## 2018-11-29 NOTE — Telephone Encounter (Signed)
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 Thanks! 

## 2018-11-29 NOTE — Telephone Encounter (Signed)
I reviewed his carotid duplex yesterday was near normal and has no concerning blockage in either carotid artery.  The report is in the chart it is not finalized and later today when I get into single see what the issue is I just may need to verify the results.  Please tell him I am sorry about the delay.

## 2018-11-29 NOTE — Telephone Encounter (Signed)
Please call patient granddaughter Neysa BonitoChristy with results of her Carotid doppler..Marland Kitchen

## 2018-11-29 NOTE — Telephone Encounter (Signed)
Patient daughter Adrian Hill informed of results per dpr

## 2018-12-06 DIAGNOSIS — H401133 Primary open-angle glaucoma, bilateral, severe stage: Secondary | ICD-10-CM | POA: Diagnosis not present

## 2018-12-19 ENCOUNTER — Ambulatory Visit: Payer: Medicare Other | Admitting: Cardiology

## 2018-12-26 DIAGNOSIS — H353222 Exudative age-related macular degeneration, left eye, with inactive choroidal neovascularization: Secondary | ICD-10-CM | POA: Diagnosis not present

## 2018-12-26 DIAGNOSIS — H353211 Exudative age-related macular degeneration, right eye, with active choroidal neovascularization: Secondary | ICD-10-CM | POA: Diagnosis not present

## 2019-01-06 ENCOUNTER — Ambulatory Visit (INDEPENDENT_AMBULATORY_CARE_PROVIDER_SITE_OTHER): Payer: Medicare Other | Admitting: Cardiology

## 2019-01-06 ENCOUNTER — Encounter: Payer: Self-pay | Admitting: Cardiology

## 2019-01-06 VITALS — BP 132/70 | HR 90 | Ht 75.5 in | Wt 210.0 lb

## 2019-01-06 DIAGNOSIS — Z7901 Long term (current) use of anticoagulants: Secondary | ICD-10-CM | POA: Diagnosis not present

## 2019-01-06 DIAGNOSIS — I447 Left bundle-branch block, unspecified: Secondary | ICD-10-CM | POA: Diagnosis not present

## 2019-01-06 DIAGNOSIS — I4821 Permanent atrial fibrillation: Secondary | ICD-10-CM | POA: Diagnosis not present

## 2019-01-06 DIAGNOSIS — G451 Carotid artery syndrome (hemispheric): Secondary | ICD-10-CM | POA: Diagnosis not present

## 2019-01-06 MED ORDER — METOPROLOL SUCCINATE ER 25 MG PO TB24: 25.0000 mg | ORAL_TABLET | Freq: Every day | ORAL | 2 refills | Status: AC

## 2019-01-06 NOTE — Patient Instructions (Signed)
Medication Instructions:  Your physician has recommended you make the following change in your medication:   START  Taking toprol XL 25 mg (1 tablet) daily  If you need a refill on your cardiac medications before your next appointment, please call your pharmacy.   Lab work: NONE If you have labs (blood work) drawn today and your tests are completely normal, you will receive your results only by: Marland Kitchen MyChart Message (if you have MyChart) OR . A paper copy in the mail If you have any lab test that is abnormal or we need to change your treatment, we will call you to review the results.  Testing/Procedures: NONE  Follow-Up: At [facility], you and your health needs are our priority.  As part of our continuing mission to provide you with exceptional heart care, we have created designated Provider Care Teams.  These Care Teams include your primary Cardiologist (physician) and Advanced Practice Providers (APPs -  Physician Assistants and Nurse Practitioners) who all work together to provide you with the care you need, when you need it. You will need a follow up appointment in 2 months.     Any Other Special Instructions Will Be Listed Below Metoprolol extended-release tablets What is this medicine? METOPROLOL (me TOE proe lole) is a beta-blocker. Beta-blockers reduce the workload on the heart and help it to beat more regularly. This medicine is used to treat high blood pressure and to prevent chest pain. It is also used to after a heart attack and to prevent an additional heart attack from occurring. This medicine may be used for other purposes; ask your health care provider or pharmacist if you have questions. COMMON BRAND NAME(S): toprol, Toprol XL What should I tell my health care provider before I take this medicine? They need to know if you have any of these conditions: -diabetes -heart or vessel disease like slow heart rate, worsening heart failure, heart block, sick sinus syndrome or  Raynaud's disease -kidney disease -liver disease -lung or breathing disease, like asthma or emphysema -pheochromocytoma -thyroid disease -an unusual or allergic reaction to metoprolol, other beta-blockers, medicines, foods, dyes, or preservatives -pregnant or trying to get pregnant -breast-feeding How should I use this medicine? Take this medicine by mouth with a glass of water. Follow the directions on the prescription label. Do not crush or chew. Take this medicine with or immediately after meals. Take your doses at regular intervals. Do not take more medicine than directed. Do not stop taking this medicine suddenly. This could lead to serious heart-related effects. Talk to your pediatrician regarding the use of this medicine in children. While this drug may be prescribed for children as young as 6 years for selected conditions, precautions do apply. Overdosage: If you think you have taken too much of this medicine contact a poison control center or emergency room at once. NOTE: This medicine is only for you. Do not share this medicine with others. What if I miss a dose? If you miss a dose, take it as soon as you can. If it is almost time for your next dose, take only that dose. Do not take double or extra doses. What may interact with this medicine? This medicine may interact with the following medications: -certain medicines for blood pressure, heart disease, irregular heart beat -certain medicines for depression, like monoamine oxidase (MAO) inhibitors, fluoxetine, or paroxetine -clonidine -dobutamine -epinephrine -isoproterenol -reserpine This list may not describe all possible interactions. Give your health care provider a list of all the  medicines, herbs, non-prescription drugs, or dietary supplements you use. Also tell them if you smoke, drink alcohol, or use illegal drugs. Some items may interact with your medicine. What should I watch for while using this medicine? Visit your  doctor or health care professional for regular check ups. Contact your doctor right away if your symptoms worsen. Check your blood pressure and pulse rate regularly. Ask your health care professional what your blood pressure and pulse rate should be, and when you should contact them. You may get drowsy or dizzy. Do not drive, use machinery, or do anything that needs mental alertness until you know how this medicine affects you. Do not sit or stand up quickly, especially if you are an older patient. This reduces the risk of dizzy or fainting spells. Contact your doctor if these symptoms continue. Alcohol may interfere with the effect of this medicine. Avoid alcoholic drinks. What side effects may I notice from receiving this medicine? Side effects that you should report to your doctor or health care professional as soon as possible: -allergic reactions like skin rash, itching or hives -cold or numb hands or feet -depression -difficulty breathing -faint -fever with sore throat -irregular heartbeat, chest pain -rapid weight gain -swollen legs or ankles Side effects that usually do not require medical attention (report to your doctor or health care professional if they continue or are bothersome): -anxiety or nervousness -change in sex drive or performance -dry skin -headache -nightmares or trouble sleeping -short term memory loss -stomach upset or diarrhea -unusually tired This list may not describe all possible side effects. Call your doctor for medical advice about side effects. You may report side effects to FDA at 1-800-FDA-1088. Where should I keep my medicine? Keep out of the reach of children. Store at room temperature between 15 and 30 degrees C (59 and 86 degrees F). Throw away any unused medicine after the expiration date. NOTE: This sheet is a summary. It may not cover all possible information. If you have questions about this medicine, talk to your doctor, pharmacist, or health  care provider.  2019 Elsevier/Gold Standard (2013-06-27 14:41:37)

## 2019-01-06 NOTE — Progress Notes (Signed)
Cardiology Office Note:    Date:  01/06/2019   ID:  Adrian Hill, DOB 04-Mar-1931, MRN [id number]  PCP:  Hadley Pen, MD  Cardiologist:  Gypsy Balsam, MD    Referring MD: Hadley Pen, MD   Chief Complaint  Patient presents with  . 1 month follow up  Doing well  History of Present Illness:    Adrian Hill is a 85 y.o. male in permanent atrial fibrillation.  Also history of TIA.  He was initiated with anticoagulation which is very appropriate I will continue.  I did put a Holter monitor on him which showed some wide-complex arrhythmia including some nonsustained ventricle tachycardia as well as frequent ventricular ectopy on top of that atrial fibrillation has been fast.  I had a long discussion with them what to do with the situation likely here his ejection fraction is normal.  Willing decided to initiate small dose of beta-blocker to see if that helps.  I warned him about potential side effects including dizziness or passing out or simply being profoundly weak if that is what happens then we need to stop his medication.  I see him back in my office in about 2 months or sooner if he get a problem.  Past Medical History:  Diagnosis Date  . Acid reflux   . BBB (bundle branch block)    Left BBB  . Diabetes (HCC)   . HTN (hypertension)   . Kidney stones   . Paroxysmal A-fib (HCC)   . TIA (transient ischemic attack)       Current Medications: Current Meds  Medication Sig  . apixaban (ELIQUIS) 5 MG TABS tablet Take 5 mg by mouth 2 (two) times daily.  Marland Kitchen latanoprost (XALATAN) 0.005 % ophthalmic solution Place 1 drop into both eyes daily.  . Multiple Vitamins-Minerals (ICAPS AREDS 2 PO) Take 2 tablets by mouth daily.  . polyethylene glycol (MIRALAX / GLYCOLAX) packet Take 17 g by mouth daily.  . valsartan-hydrochlorothiazide (DIOVAN-HCT) 320-12.5 MG tablet Take 1 tablet by mouth daily.     Allergies:   Patient has no known allergies.   Social History    Socioeconomic History  . Marital status: Widowed    Spouse name: Not on file  . Number of children: Not on file  . Years of education: Not on file  . Highest education level: Not on file  Occupational History  . Not on file  Social Needs  . Financial resource strain: Not on file  . Food insecurity:    Worry: Not on file    Inability: Not on file  . Transportation needs:    Medical: Not on file    Non-medical: Not on file  Tobacco Use  . Smoking status: Former Smoker    Types: Cigars    Last attempt to quit: 1996    Years since quitting: 24.1  . Smokeless tobacco: Never Used  Substance and Sexual Activity  . Alcohol use: No  . Drug use: No  . Sexual activity: Not on file  Lifestyle  . Physical activity:    Days per week: Not on file    Minutes per session: Not on file  . Stress: Not on file  Relationships  . Social connections:    Talks on phone: Not on file    Gets together: Not on file    Attends religious service: Not on file    Active member of club or organization: Not on file    Attends meetings  of clubs or organizations: Not on file    Relationship status: Not on file  Other Topics Concern  . Not on file  Social History Narrative  . Not on file     Family History: The patient's family history includes Benign prostatic hyperplasia in his brother; Cancer in his father; Diabetes in his mother; Heart disease in his mother; Hypertension in his brother. ROS:   Please see the history of present illness.    All 14 point review of systems negative except as described per history of present illness  EKGs/Labs/Other Studies Reviewed:      Recent Labs: No results found for requested labs within last 8760 hours.  Recent Lipid Panel    Component Value Date/Time   CHOL 141 07/22/2017 0554   TRIG 83 07/22/2017 0554   HDL 37 (L) 07/22/2017 0554   CHOLHDL 3.8 07/22/2017 0554   VLDL 17 07/22/2017 0554   LDLCALC 87 07/22/2017 0554    Physical Exam:    VS:   BP 132/70   Pulse 90   Ht 6' 3.5" (1.918 m)   Wt 210 lb (95.3 kg)   SpO2 96%   BMI 25.90 kg/m     Wt Readings from Last 3 Encounters:  01/06/19 210 lb (95.3 kg)  11/18/18 210 lb 12.8 oz (95.6 kg)  07/21/17 191 lb 3.2 oz (86.7 kg)     GEN:  Well nourished, well developed in no acute distress HEENT: Normal NECK: No JVD; No carotid bruits LYMPHATICS: No lymphadenopathy CARDIAC: Irregularly irregular, no murmurs, no rubs, no gallops RESPIRATORY:  Clear to auscultation without rales, wheezing or rhonchi  ABDOMEN: Soft, non-tender, non-distended MUSCULOSKELETAL:  No edema; No deformity  SKIN: Warm and dry LOWER EXTREMITIES: no swelling NEUROLOGIC:  Alert and oriented x 3 PSYCHIATRIC:  Normal affect   ASSESSMENT:    1. Left bundle branch block   2. Permanent atrial fibrillation   3. Hemispheric carotid artery syndrome   4. Current use of long term anticoagulation    PLAN:    In order of problems listed above:  1. Left bundle branch block known left ventricular ejection fraction within normal limits.  We will continue monitoring. 2. Permanent atrial fibrillation: Plan as outlined above 3. History of TIA.  Completely recovered.  Anticoagulated will continue. 4. Anticoagulate with Eliquis will continue that management. 5. Complex ventricular ectopy.  Will initiate small dose of beta-blocker his ejection fraction was normal by echocardiogram.   Medication Adjustments/Labs and Tests Ordered: Current medicines are reviewed at length with the patient today.  Concerns regarding medicines are outlined above.  No orders of the defined types were placed in this encounter.  Medication changes: No orders of the defined types were placed in this encounter.   Signed, Georgeanna Lea, MD, [facility] 01/06/2019 2:43 PM    [facility]

## 2019-01-16 DIAGNOSIS — S39012A Strain of muscle, fascia and tendon of lower back, initial encounter: Secondary | ICD-10-CM | POA: Diagnosis not present

## 2019-01-23 DIAGNOSIS — H353211 Exudative age-related macular degeneration, right eye, with active choroidal neovascularization: Secondary | ICD-10-CM | POA: Diagnosis not present

## 2019-01-31 IMAGING — MR MR MRA HEAD W/O CM
11 of 17 series · 27 of 48 positions shown · IV contrast (Yes   MULTIHANCE)
Comparison: CT HEAD July 21, 2017 at 1155 hours [address] CT
cervical spine December 04, 2016 and MRI/MRA head August 25, 2011

CLINICAL DATA: Transient expressive dysphasia. Frontal headache.
History of atrial fibrillation on Eliquis, TIA.

EXAM:
MRI HEAD WITHOUT AND WITH CONTRAST
MRA HEAD WITHOUT CONTRAST
MRI NECK WITHOUT AND WITH CONTRAST
TECHNIQUE: Multiplanar, multiecho pulse sequences of the brain and surrounding
structures were obtained without and with intravenous contrast.
Angiographic images of the head were obtained using MRA technique
without contrast. Multiplanar, multiecho pulse sequences of the neck
and surrounding structures were obtained without and with
intravenous contrast.
CONTRAST:  18mL MULTIHANCE GADOBENATE DIMEGLUMINE 529 MG/ML IV SOLN

[Series 3: DWI · axial · 3.0mm · 1.09mm/px · z∈[-12,+129]mm · 4 of 96 slices shown (1 of 4)]
[im 1/96]
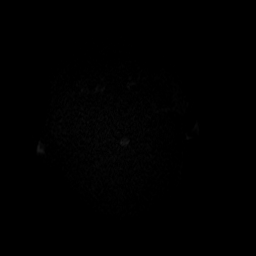
[im 32/96]
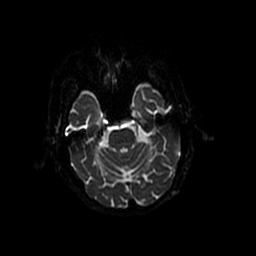
[im 64/96]
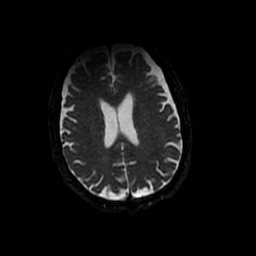
[im 96/96]
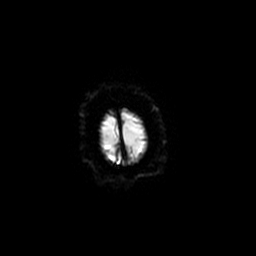

[Series 4: (id) mt fs · axial · 1.4mm · 0.43mm/px · z∈[-27,+79]mm · 8 of 152 slices shown]
[im 1/152]
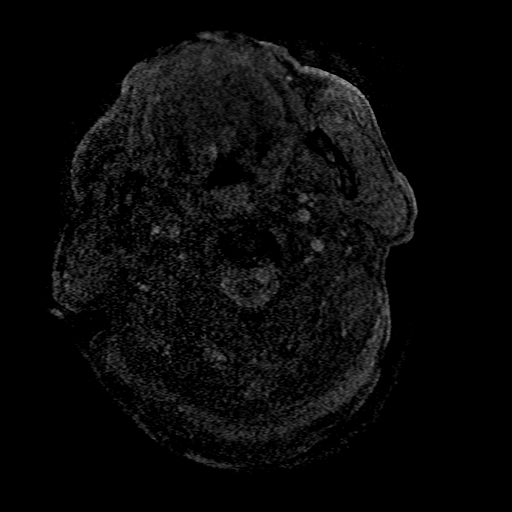
[im 22/152]
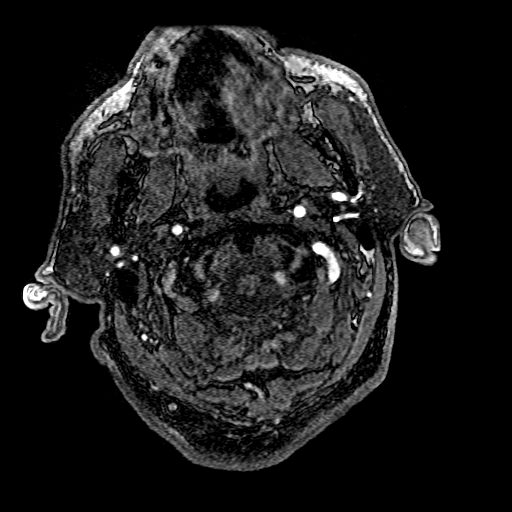
[im 44/152]
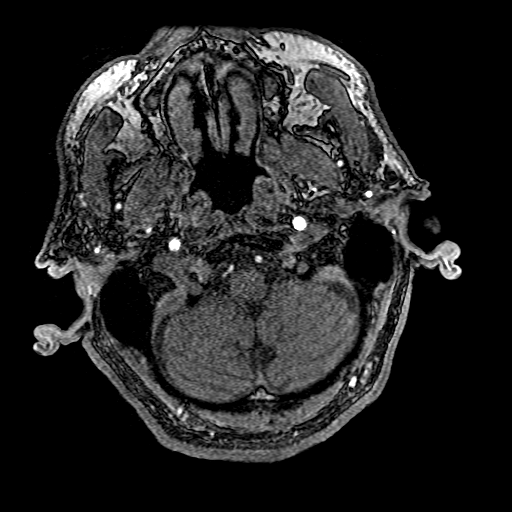
[im 65/152]
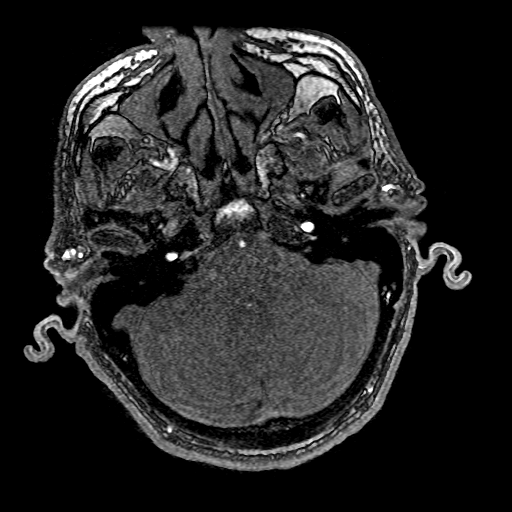
[im 87/152]
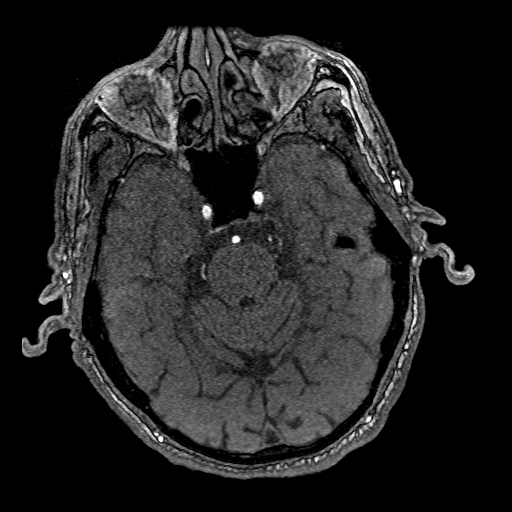
[im 108/152]
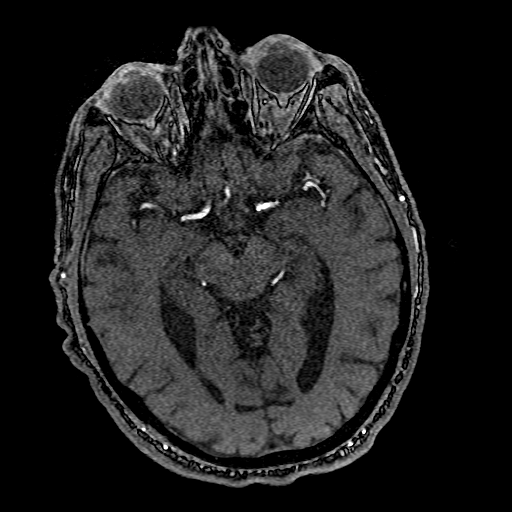
[im 130/152]
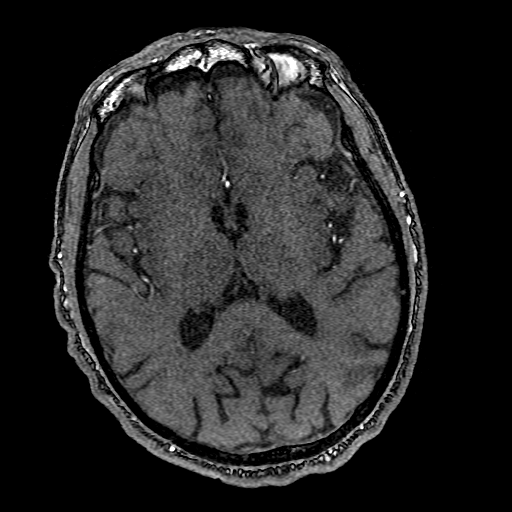
[im 152/152]
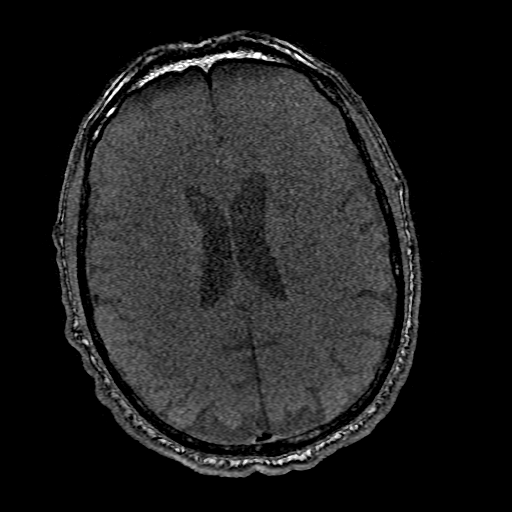

[Series 5: DWI · coronal · 5.0mm · 1.09mm/px · 4 of 66 slices shown (2 of 4)]
[im 1/66]
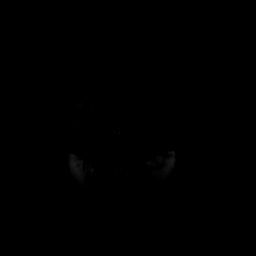
[im 22/66]
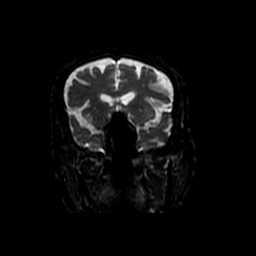
[im 44/66]
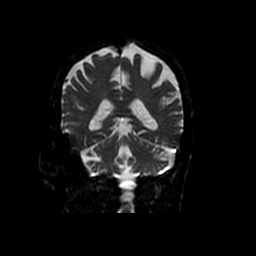
[im 66/66]
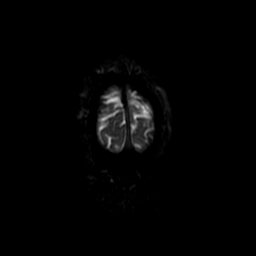

[Series 6: T1 · sagittal · 5.0mm · 0.47mm/px · 1 of 23 slices shown]
[im 1/23]
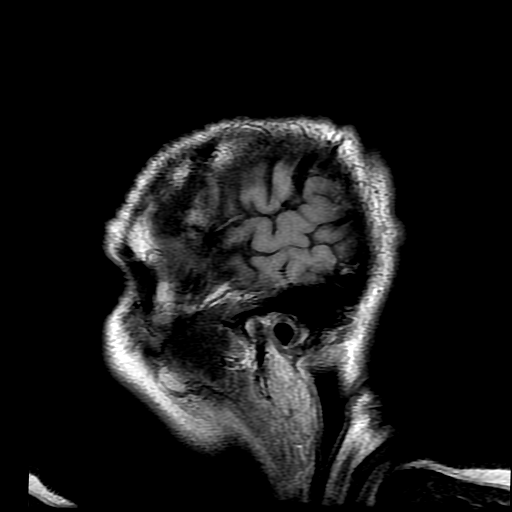

[Series 7: T2 · axial · 5.0mm · 0.43mm/px · 1 of 24 slices shown (1 of 2)]
[im 1/24]
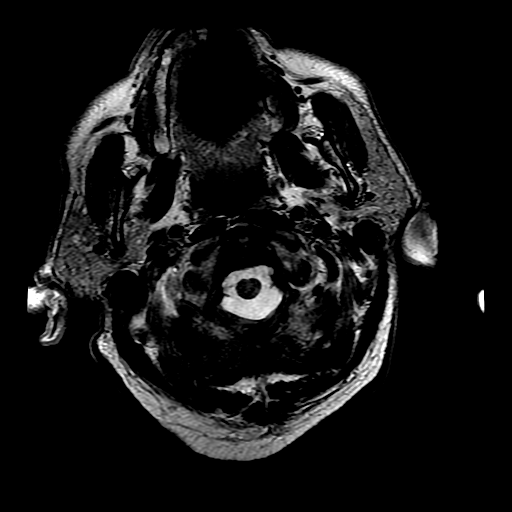

[Series 8: FLAIR · axial · 5.0mm · 0.43mm/px · 1 of 24 slices shown]
[im 1/24]
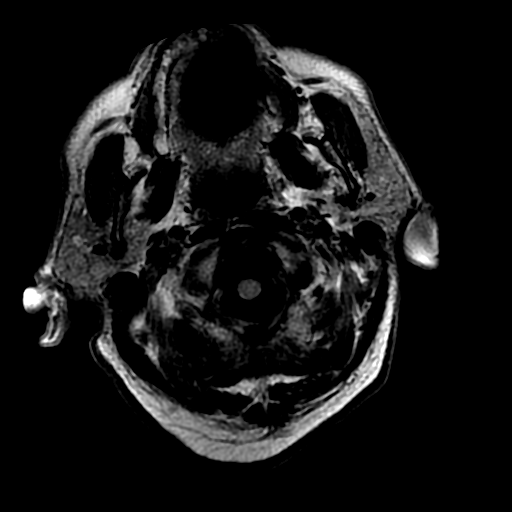

[Series 9: ax mpgr · axial · 5.0mm · 0.43mm/px · 1 of 24 slices shown]
[im 1/24]
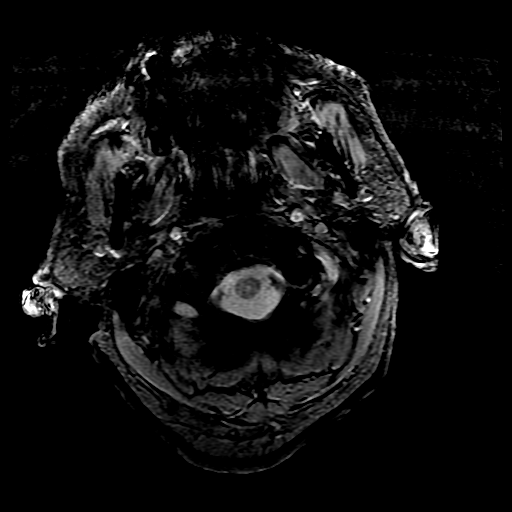

[Series 11: T2 · coronal · 5.0mm · 0.39mm/px · 1 of 25 slices shown (2 of 2)]
[im 1/25]
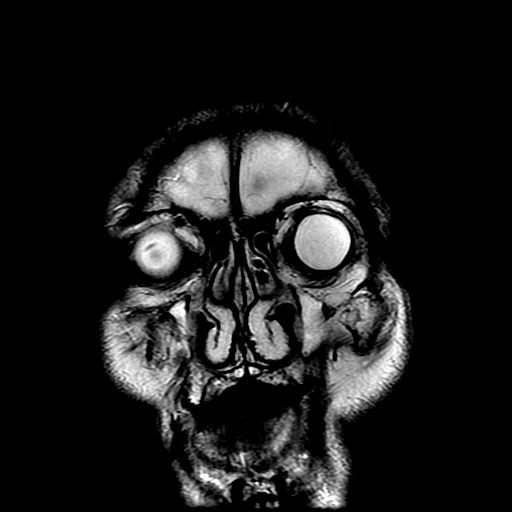

[Series 17: T1 post-contrast · coronal · 5.0mm · 0.39mm/px · 1 of 25 slices shown]
[im 1/25]
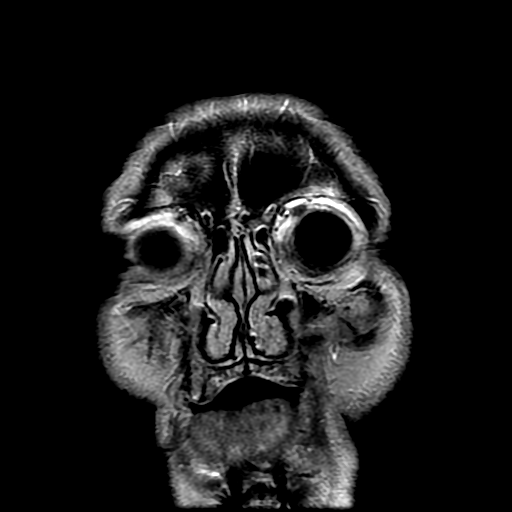

[Series 300: DWI · axial · 3.0mm · 1.09mm/px · z∈[-12,+129]mm · 3 of 48 slices shown (3 of 4)]
[im 1/48]
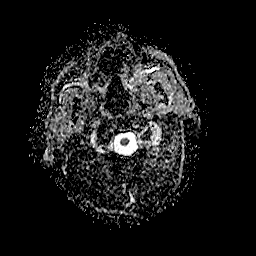
[im 24/48]
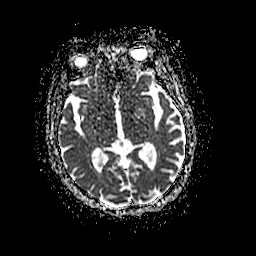
[im 48/48]
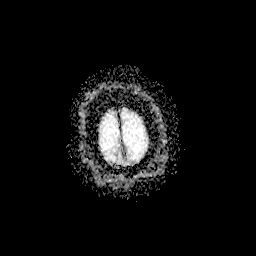

[Series 500: DWI · coronal · 5.0mm · 1.09mm/px · 2 of 33 slices shown (4 of 4)]
[im 1/33]
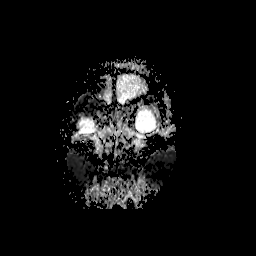
[im 33/33]
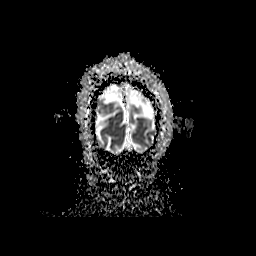

[27 of 48 positions shown; findings below may reference images not displayed]

FINDINGS: MRI HEAD FINDINGS

INTRACRANIAL CONTENTS: No reduced diffusion to suggest acute
ischemia. No susceptibility artifact to suggest hemorrhage. The
ventricles and sulci are normal for patient's age. No suspicious
parenchymal signal, masses, mass effect. A few scattered
subcentimeter supratentorial white matter FLAIR T2 hyperintensities
compatible with mild chronic small vessel ischemic disease, less
than expected for age. No abnormal intraparenchymal or extra-axial
enhancement. No abnormal extra-axial fluid collections. No
extra-axial masses.

VASCULAR: Normal major intracranial vascular flow voids present at
skull base.

SKULL AND UPPER CERVICAL SPINE: No abnormal sellar expansion. No
suspicious calvarial bone marrow signal. Craniocervical junction
maintained.

SINUSES/ORBITS: Pan paranasal sinusitis with atretic maxillary
sinuses compatible with chronic sinusitis. The included ocular
globes and orbital contents are non-suspicious.

OTHER: Patient is edentulous.

MRA HEAD FINDINGS

ANTERIOR CIRCULATION: Normal flow related enhancement of the
included cervical, petrous, cavernous and supraclinoid internal
carotid arteries. Patent anterior communicating artery. Patent
anterior and middle cerebral arteries, including distal segments.

No large vessel occlusion, high-grade stenosis, aneurysm.

POSTERIOR CIRCULATION: LEFT vertebral artery is dominant. Basilar
artery is patent, with normal flow related enhancement of the main
branch vessels. Patent posterior cerebral arteries.

No large vessel occlusion, high-grade stenosis, aneurysm.

ANATOMIC VARIANTS: Hypoplastic RIGHT A1 segment.

Source images and MIP images were reviewed.

MRI NECK FINDINGS

ANTERIOR CIRCULATION: The common carotid arteries are widely patent
bilaterally. The carotid bifurcations are patent bilaterally without
hemodynamically significant stenosis by NASCET criteria. No flow
limiting stenosis or luminal irregularity.

POSTERIOR CIRCULATION: Bilateral vertebral arteries are patent to
the vertebrobasilar junction. LEFT vertebral artery is dominant. No
flow limiting stenosis or luminal irregularity.

Source images and MIP images were reviewed.
IMPRESSION: Negative MRI of the head with and without contrast for age. Chronic
sinusitis.

Negative MRA of the head.

Negative MRA of the neck.

## 2019-03-06 DIAGNOSIS — H353211 Exudative age-related macular degeneration, right eye, with active choroidal neovascularization: Secondary | ICD-10-CM | POA: Diagnosis not present

## 2019-03-19 ENCOUNTER — Telehealth: Payer: Medicare Other | Admitting: Cardiology

## 2019-04-22 DIAGNOSIS — N183 Chronic kidney disease, stage 3 (moderate): Secondary | ICD-10-CM | POA: Diagnosis not present

## 2019-04-22 DIAGNOSIS — I480 Paroxysmal atrial fibrillation: Secondary | ICD-10-CM | POA: Diagnosis not present

## 2019-04-22 DIAGNOSIS — I10 Essential (primary) hypertension: Secondary | ICD-10-CM | POA: Diagnosis not present

## 2019-04-22 DIAGNOSIS — E1165 Type 2 diabetes mellitus with hyperglycemia: Secondary | ICD-10-CM | POA: Diagnosis not present

## 2019-04-22 DIAGNOSIS — E1122 Type 2 diabetes mellitus with diabetic chronic kidney disease: Secondary | ICD-10-CM | POA: Diagnosis not present

## 2019-05-01 DIAGNOSIS — R5383 Other fatigue: Secondary | ICD-10-CM | POA: Diagnosis not present

## 2019-05-02 DIAGNOSIS — H353211 Exudative age-related macular degeneration, right eye, with active choroidal neovascularization: Secondary | ICD-10-CM | POA: Diagnosis not present

## 2019-05-02 DIAGNOSIS — H353222 Exudative age-related macular degeneration, left eye, with inactive choroidal neovascularization: Secondary | ICD-10-CM | POA: Diagnosis not present

## 2019-06-03 DIAGNOSIS — R5383 Other fatigue: Secondary | ICD-10-CM | POA: Diagnosis not present

## 2019-06-12 DIAGNOSIS — H353211 Exudative age-related macular degeneration, right eye, with active choroidal neovascularization: Secondary | ICD-10-CM | POA: Diagnosis not present

## 2019-07-04 DIAGNOSIS — H401133 Primary open-angle glaucoma, bilateral, severe stage: Secondary | ICD-10-CM | POA: Diagnosis not present

## 2019-07-08 DIAGNOSIS — R5383 Other fatigue: Secondary | ICD-10-CM | POA: Diagnosis not present

## 2019-07-24 DIAGNOSIS — H353211 Exudative age-related macular degeneration, right eye, with active choroidal neovascularization: Secondary | ICD-10-CM | POA: Diagnosis not present

## 2019-09-03 DIAGNOSIS — R5383 Other fatigue: Secondary | ICD-10-CM | POA: Diagnosis not present

## 2019-09-19 DIAGNOSIS — H401133 Primary open-angle glaucoma, bilateral, severe stage: Secondary | ICD-10-CM | POA: Diagnosis not present

## 2019-10-16 DIAGNOSIS — R5383 Other fatigue: Secondary | ICD-10-CM | POA: Diagnosis not present

## 2019-10-16 DIAGNOSIS — H353131 Nonexudative age-related macular degeneration, bilateral, early dry stage: Secondary | ICD-10-CM | POA: Diagnosis not present

## 2019-10-16 DIAGNOSIS — H353211 Exudative age-related macular degeneration, right eye, with active choroidal neovascularization: Secondary | ICD-10-CM | POA: Diagnosis not present

## 2019-12-08 DEATH — deceased
